# Patient Record
Sex: Male | Born: 1955 | Race: Black or African American | Hispanic: No | Marital: Single | Smoking: Current some day smoker
Health system: Southern US, Community
[De-identification: ages and names within clinical notes are randomized; demographics above are authoritative.]

---

## 2005-08-16 ENCOUNTER — Other Ambulatory Visit: Payer: Self-pay

## 2005-08-16 ENCOUNTER — Inpatient Hospital Stay: Payer: Self-pay | Admitting: Unknown Physician Specialty

## 2005-09-02 ENCOUNTER — Ambulatory Visit: Payer: Self-pay | Admitting: Internal Medicine

## 2007-03-01 IMAGING — CR DG TIBIA/FIBULA 2V*L*
1 series · 3 of 3 positions shown · non-contrast
Comparison: none

REASON FOR EXAM: left tib/fib pain
COMMENTS:  LMP: (Male)

RESULT:          AP and lateral view reveals a lateral tibial plateau
fracture as well as a comminuted fracture of the head of the fibula.  The
remainder of the tibia and fibula appear intact.

[Series 1: view not recorded · 0.17mm/px · 3 of 3 slices shown]
[im 1/3]
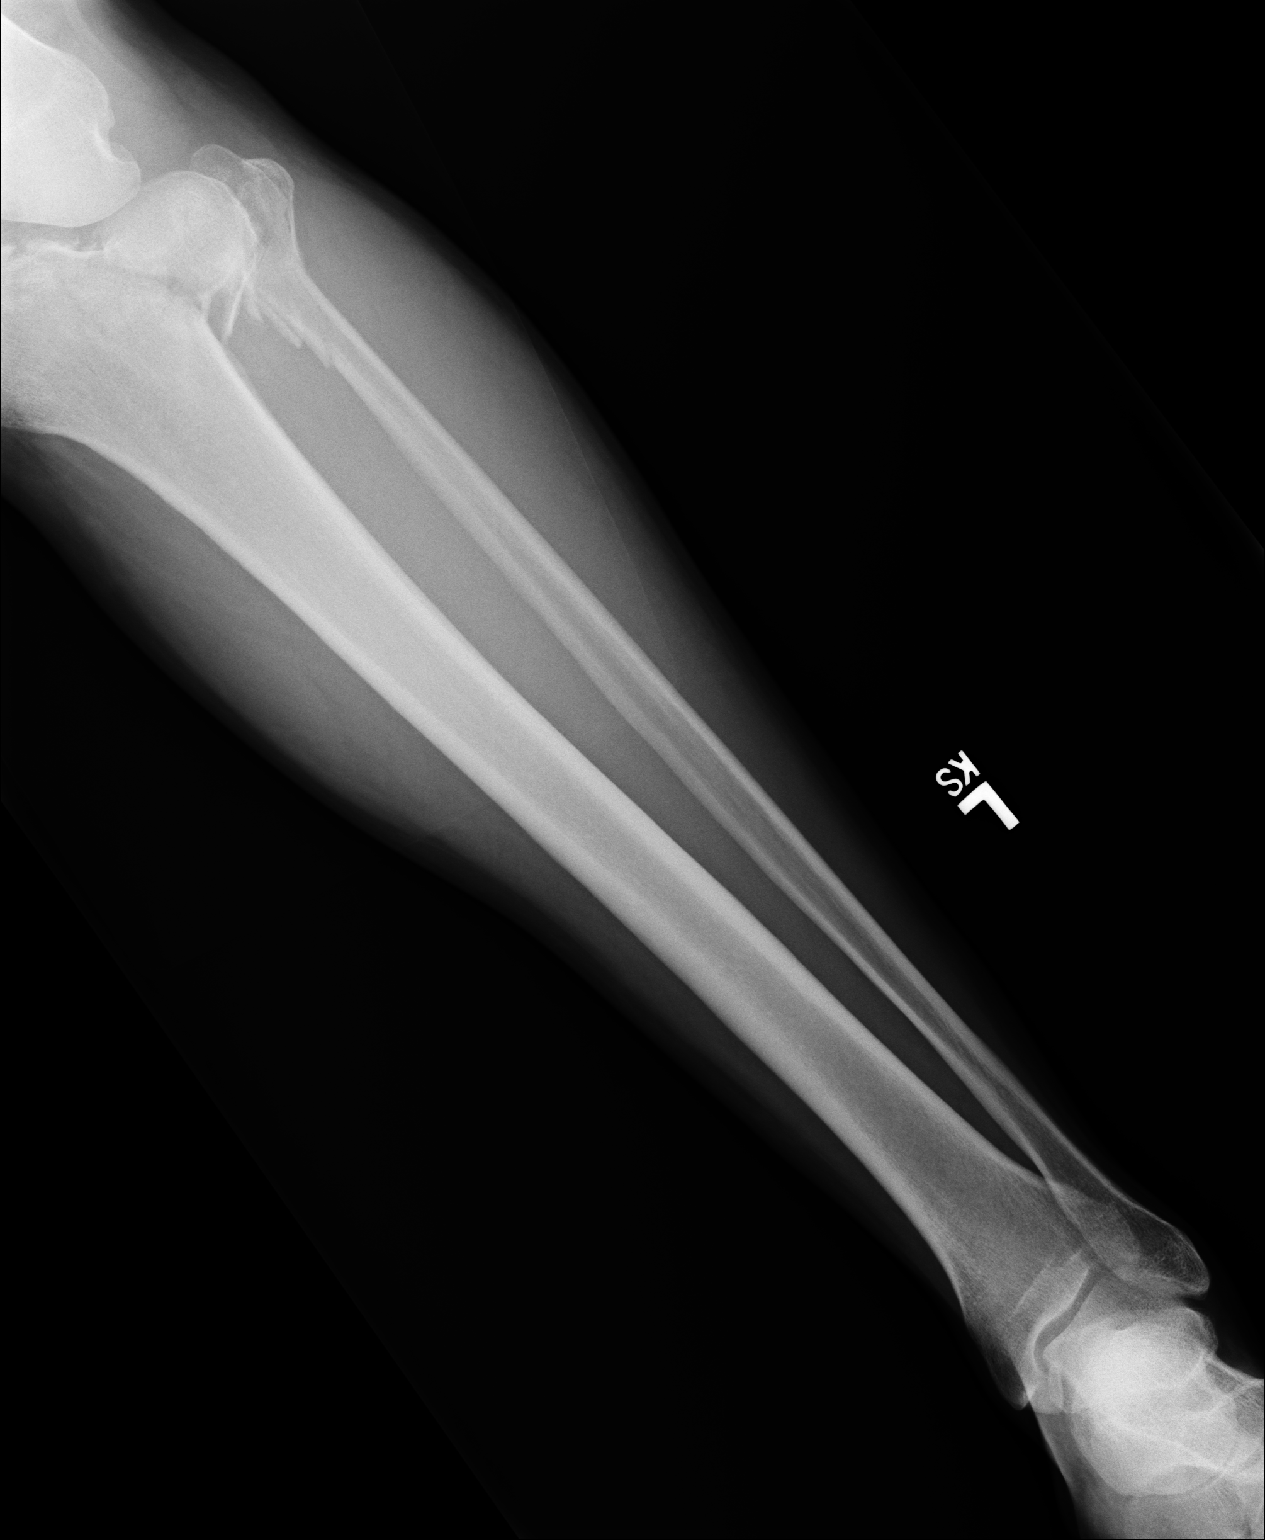
[im 2/3]
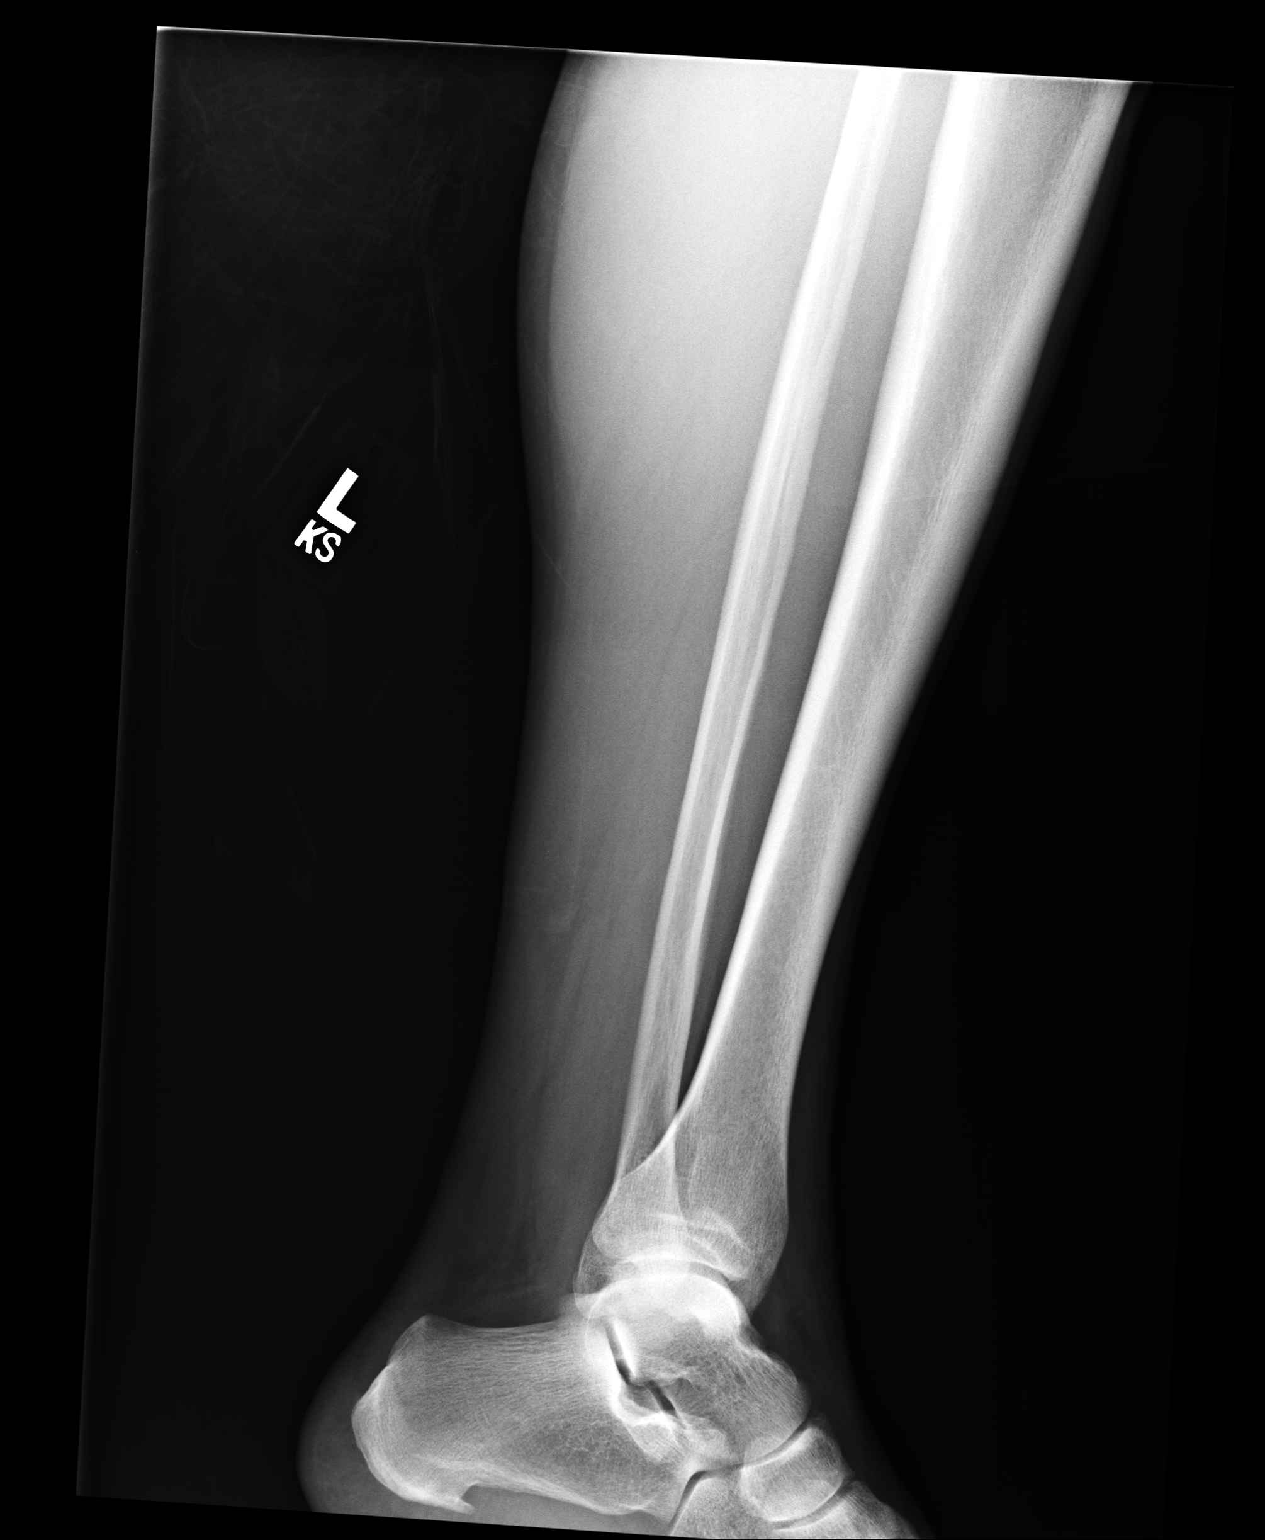
[im 3/3]
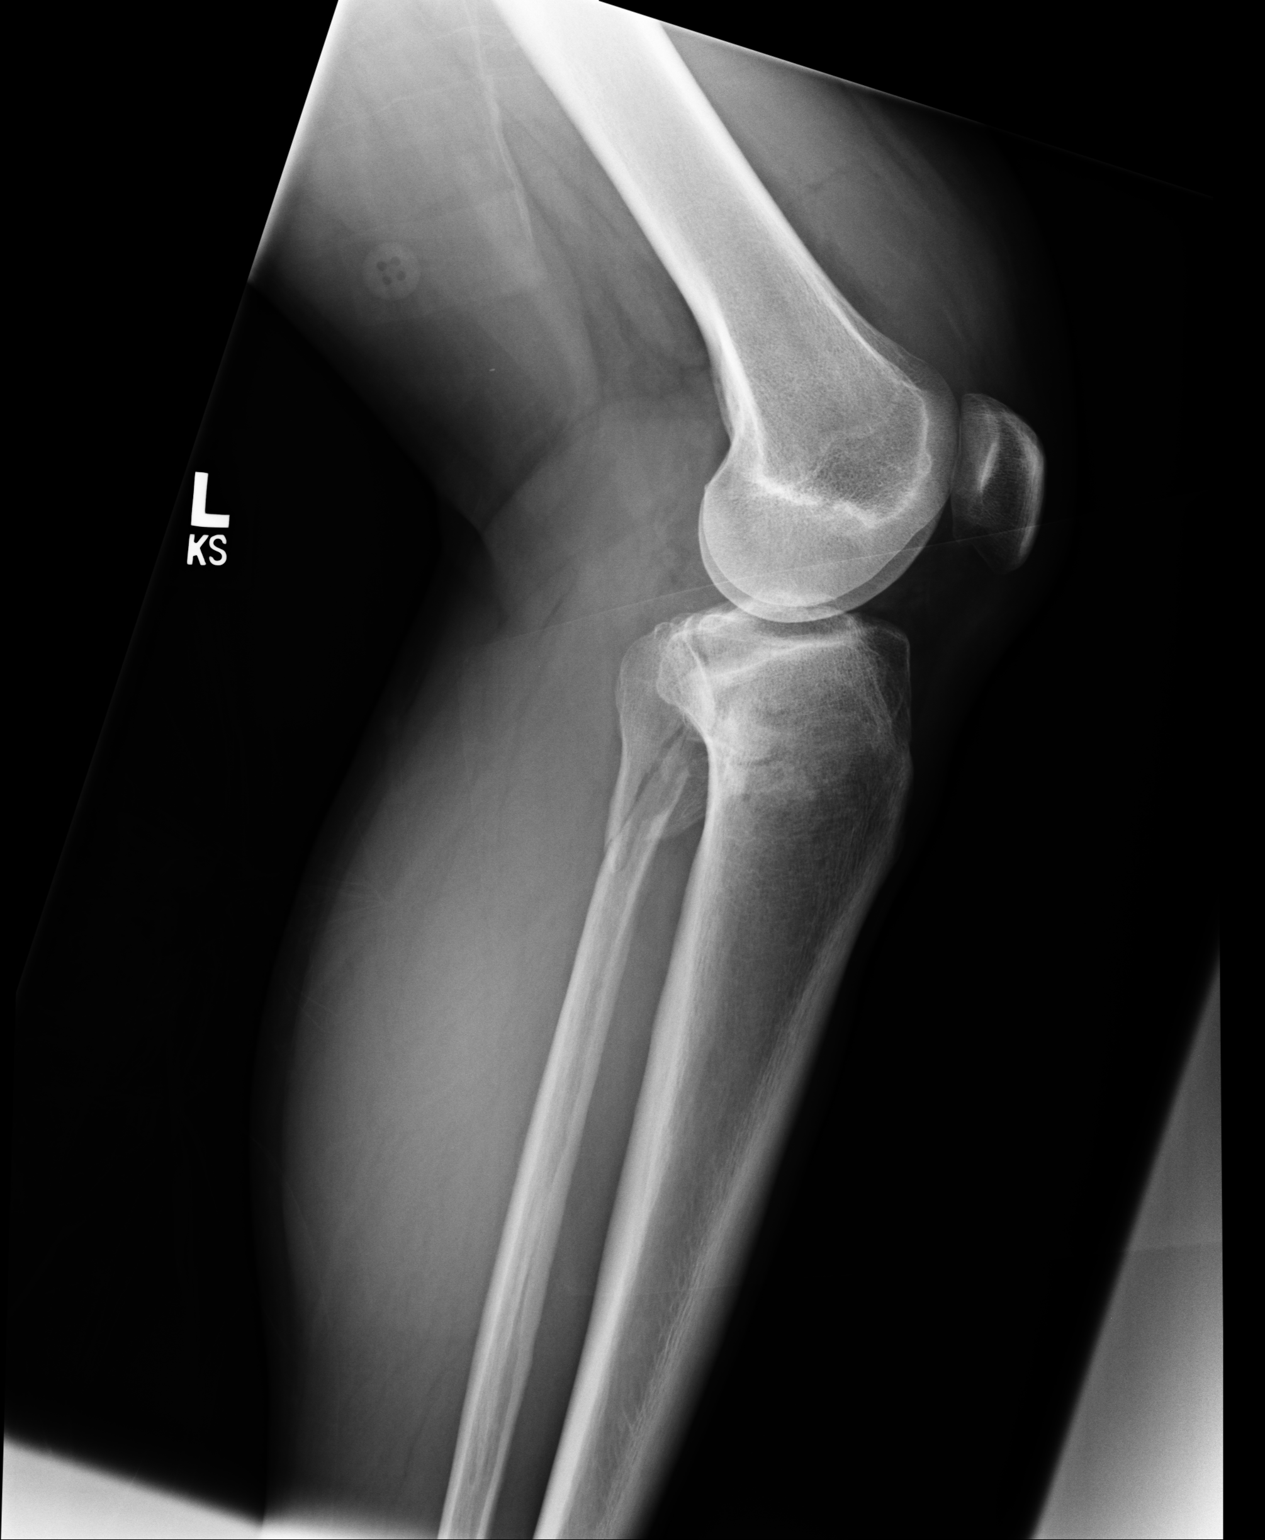

[3 of 3 positions shown; findings below may reference images not displayed]

IMPRESSION: Fracture of the proximal tibia and fibula.  No
additional fractures are seen.

## 2007-03-01 IMAGING — CT CT OF THE LEFT KNEE WITHOUT CONTRAST
3 of 6 series · 15 of 35 positions shown, 18 images · non-contrast
Comparison: none

REASON FOR EXAM: knee injury
COMMENTS:

[Series 4: inspace · axial · 0.74mm/px · z∈[+438,+642]mm · 8 of 375 slices shown, 10 images]
[im 42/375  soft-tissue]
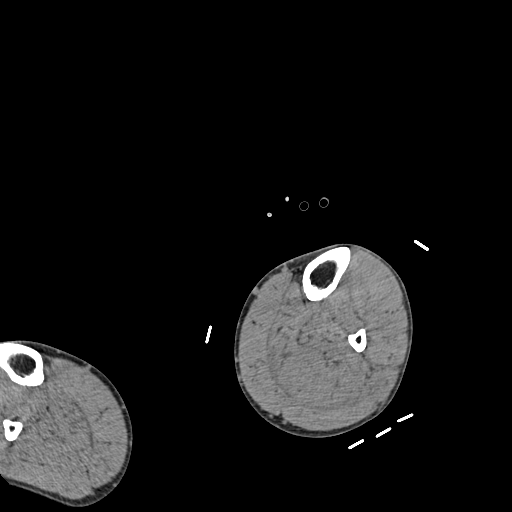
[im 42/375  bone]
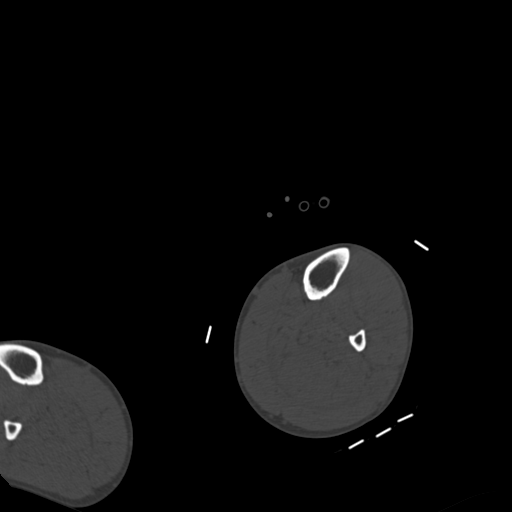
[im 84/375  bone]
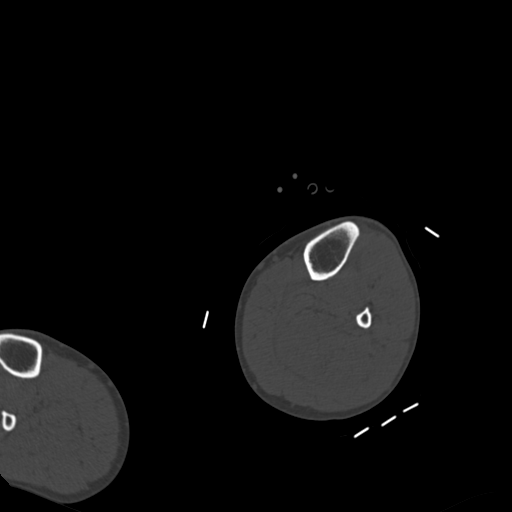
[im 125/375  bone]
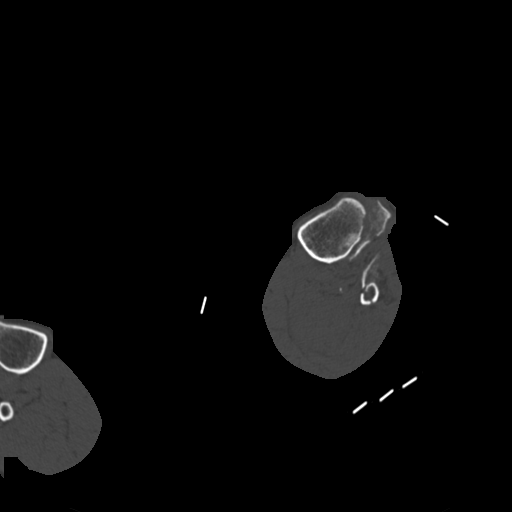
[im 167/375  bone]
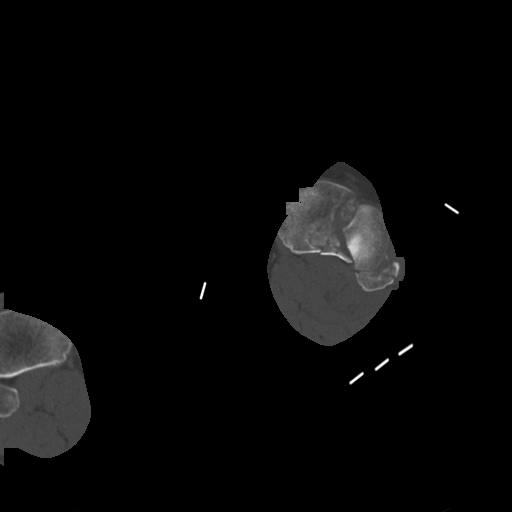
[im 208/375  soft-tissue]
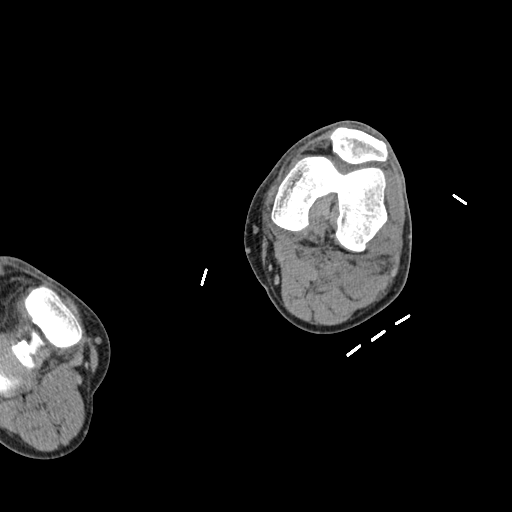
[im 208/375  bone]
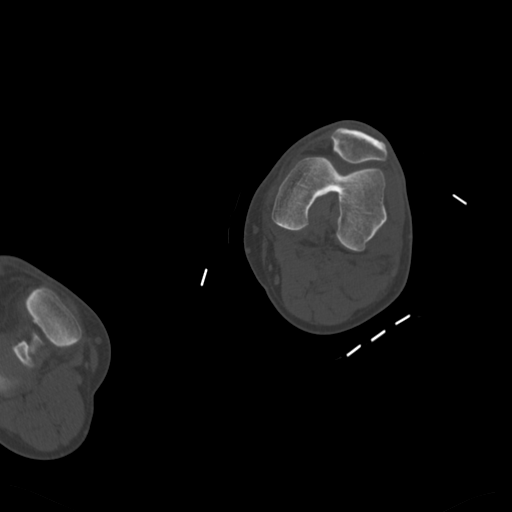
[im 250/375  bone]
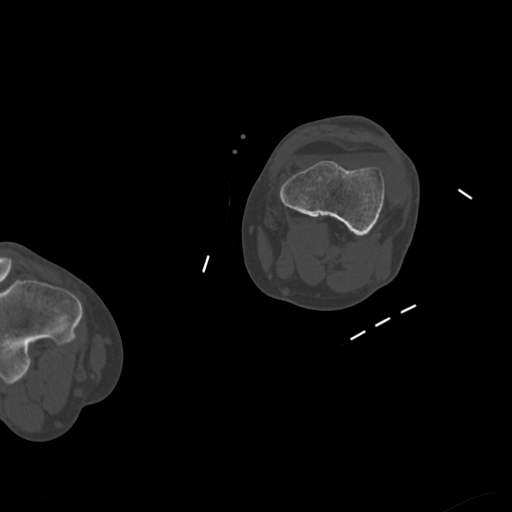
[im 291/375  bone]
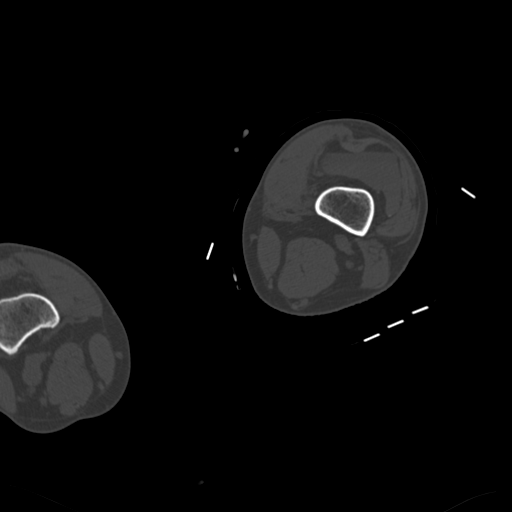
[im 333/375  bone]
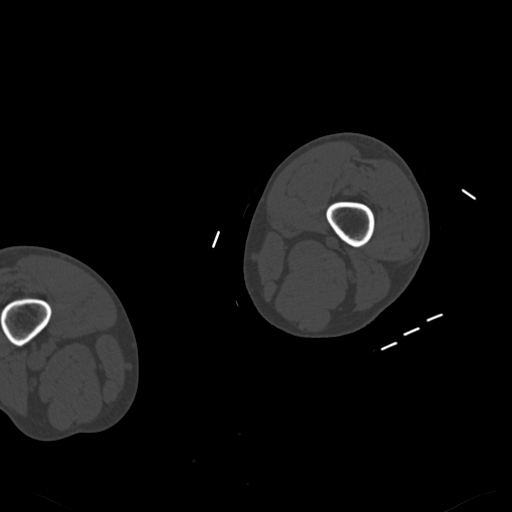

[Series 602: coronals · coronal · 0.74mm/px · 2 of 67 slices shown]
[im 18/67  bone]
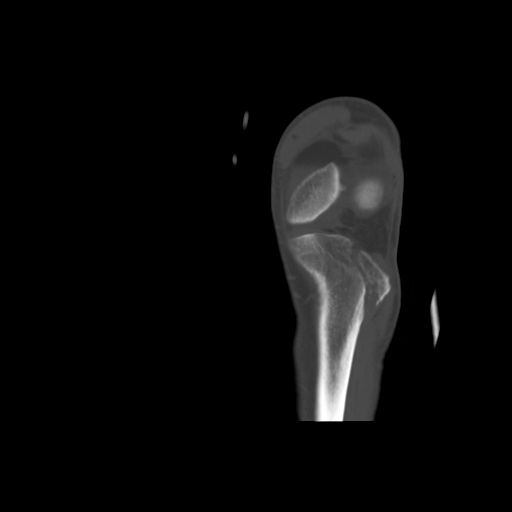
[im 42/67  bone]
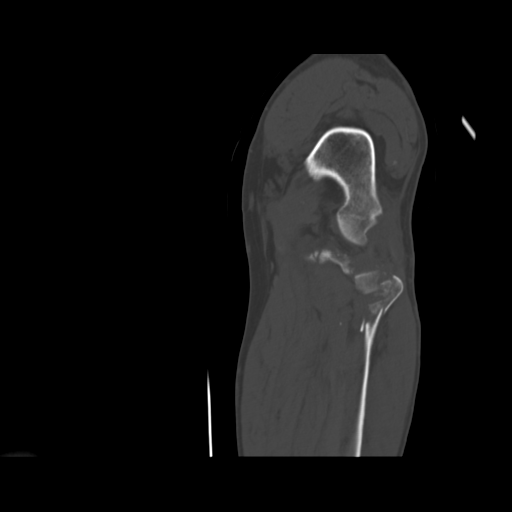

[Series 604: sagitals · sagittal · 0.74mm/px · 5 of 60 slices shown, 6 images]
[im 20/60  bone]
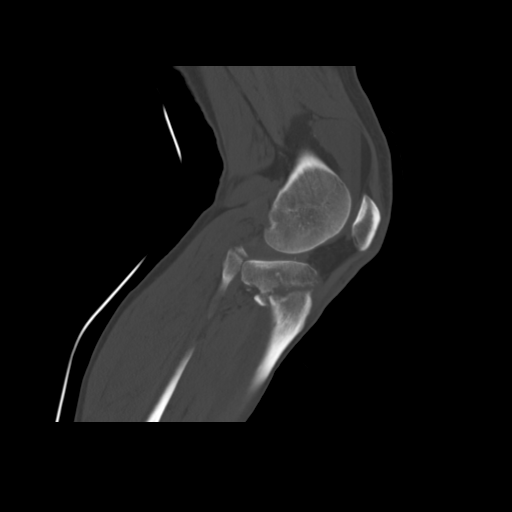
[im 25/60  bone]
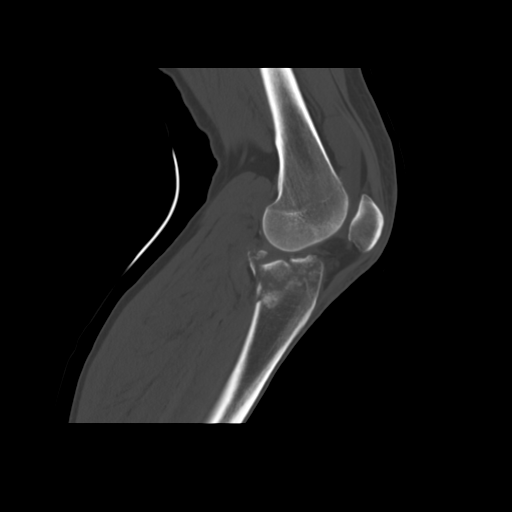
[im 30/60  soft-tissue]
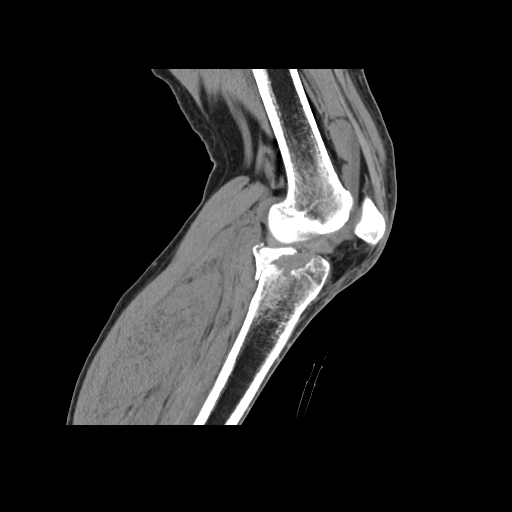
[im 30/60  bone]
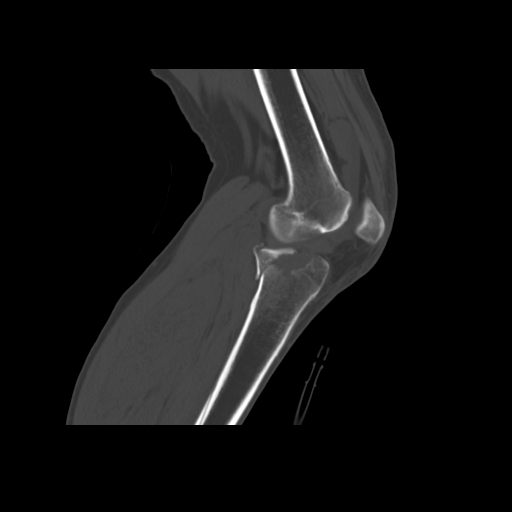
[im 35/60  bone]
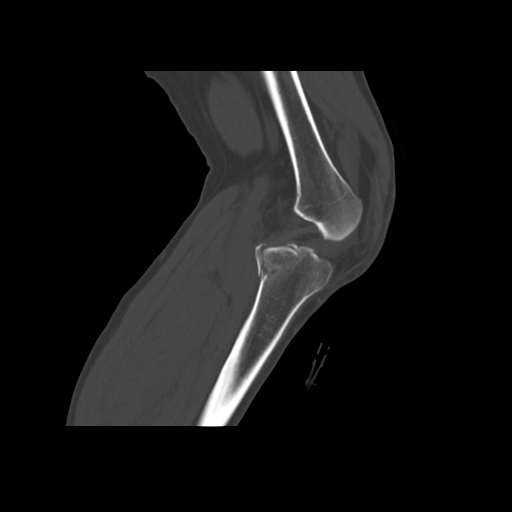
[im 40/60  bone]
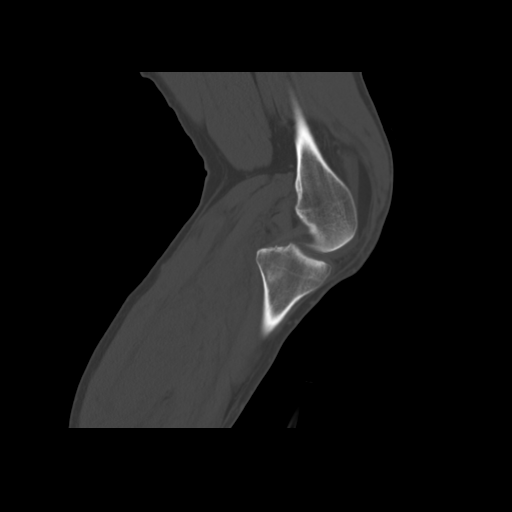

[15 of 35 positions shown; findings below may reference images not displayed]

PROCEDURE:     CT  - CT KNEE LEFT WO  - August 16, 2005  [DATE]

RESULT:          An emergent CT was performed status post a fall.

There is noted a comminuted fracture through the lateral tibial plateau.
The major fracture fragment is depressed approximately 1.2 cm.  Smaller
fracture fragments are noted near the intercondylar notch.  The fracture
line does extend posteriorly.  There is noted a comminuted fracture of the
fibular head and extending in the proximal diaphysis region of the fibula.
IMPRESSION: 1.     A comminuted lateral tibial plateau fracture with depression
approximately 1.2 cm.
2.     Comminuted fracture of the fibular head.

## 2020-04-03 ENCOUNTER — Encounter: Payer: Self-pay | Admitting: *Deleted

## 2020-04-03 ENCOUNTER — Encounter: Payer: No Typology Code available for payment source | Attending: Internal Medicine | Admitting: *Deleted

## 2020-04-03 ENCOUNTER — Other Ambulatory Visit: Payer: Self-pay

## 2020-04-03 DIAGNOSIS — I509 Heart failure, unspecified: Secondary | ICD-10-CM | POA: Insufficient documentation

## 2020-04-03 DIAGNOSIS — I5022 Chronic systolic (congestive) heart failure: Secondary | ICD-10-CM

## 2020-04-03 DIAGNOSIS — C159 Malignant neoplasm of esophagus, unspecified: Secondary | ICD-10-CM | POA: Insufficient documentation

## 2020-04-03 NOTE — Progress Notes (Signed)
Virtual orientation call completed today. he has an appointment on Date: 04/09/2020 for EP eval and gym Orientation.  Documentation of diagnosis can be found in media scan [facility] records  .  Hunter Tran is a current tobacco user. Intervention for tobacco cessation was provided at the initial medical review. He was asked about readiness to quit and reported he is ready to quit . Patient was advised and educated about tobacco cessation using combination therapy, tobacco cessation classes, quit line, and quit smoking apps. Patient demonstrated understanding of this material. Staff will continue to provide encouragement and follow up with the patient throughout the program.

## 2020-04-09 ENCOUNTER — Ambulatory Visit: Payer: Self-pay

## 2020-04-12 ENCOUNTER — Other Ambulatory Visit: Payer: Self-pay

## 2020-04-12 VITALS — Ht 70.25 in | Wt 236.7 lb

## 2020-04-12 DIAGNOSIS — I5022 Chronic systolic (congestive) heart failure: Secondary | ICD-10-CM

## 2020-04-12 NOTE — Patient Instructions (Signed)
Patient Instructions  Patient Details  Name: Hunter Tran MRN: [id number] Date of Birth: 03/06/1956 Referring Provider:  Yehuda Savannah, MD  Below are your personal goals for exercise, nutrition, and risk factors. Our goal is to help you stay on track towards obtaining and maintaining these goals. We will be discussing your progress on these goals with you throughout the program.  Initial Exercise Prescription:  Initial Exercise Prescription - 04/12/20 1300      Date of Initial Exercise RX and Referring Provider   Date 04/12/20    Referring Provider [name] [place] ([place])      Treadmill   MPH 2.3    Grade 0.5    Minutes 15    METs 2.92      Recumbant Bike   Level 3    RPM 60    Watts 20    Minutes 15    METs 2.7      NuStep   Level 2    SPM 80    Minutes 15    METs 2.7      T5 Nustep   Level 2    SPM 80    Minutes 15    METs 2.7      Prescription Details   Frequency (times per week) 2    Duration Progress to 30 minutes of continuous aerobic without signs/symptoms of physical distress      Intensity   THRR 40-80% of Max Heartrate 110-141    Ratings of Perceived Exertion 11-13    Perceived Dyspnea 0-4      Progression   Progression Continue to progress workloads to maintain intensity without signs/symptoms of physical distress.      Resistance Training   Training Prescription Yes    Weight 3 lb    Reps 10-15           Exercise Goals: Frequency: Be able to perform aerobic exercise two to three times per week in program working toward 2-5 days per week of home exercise.  Intensity: Work with a perceived exertion of 11 (fairly light) - 15 (hard) while following your exercise prescription.  We will make changes to your prescription with you as you progress through the program.   Duration: Be able to do 30 to 45 minutes of continuous aerobic exercise in addition to a 5 minute warm-up and a 5 minute cool-down routine.   Nutrition Goals: Your personal  nutrition goals will be established when you do your nutrition analysis with the dietician.  The following are general nutrition guidelines to follow: Cholesterol < 200mg /day Sodium < 1500mg /day Fiber: Men over 50 yrs - 30 grams per day  Personal Goals:  Personal Goals and Risk Factors at Admission - 04/12/20 1408      Core Components/Risk Factors/Patient Goals on Admission    Weight Management Weight Loss    Intervention Weight Management: Develop a combined nutrition and exercise program designed to reach desired caloric intake, while maintaining appropriate intake of nutrient and fiber, sodium and fats, and appropriate energy expenditure required for the weight goal.;Weight Management/Obesity: Establish reasonable short term and long term weight goals.    Admit Weight 236 lb (107 kg)    Goal Weight: Short Term 231 lb (104.8 kg)    Goal Weight: Long Term 190 lb (86.2 kg)    Expected Outcomes Short Term: Continue to assess and modify interventions until short term weight is achieved;Long Term: Adherence to nutrition and physical activity/exercise program aimed toward attainment of established weight goal;Weight  Loss: Understanding of general recommendations for a balanced deficit meal plan, which promotes 1-2 lb weight loss per week and includes a negative energy balance of [phone] kcal/d;Understanding recommendations for meals to include 15-35% energy as protein, 25-35% energy from fat, 35-60% energy from carbohydrates, less than 200mg  of dietary cholesterol, 20-35 gm of total fiber daily;Understanding of distribution of calorie intake throughout the day with the consumption of 4-5 meals/snacks    Tobacco Cessation Yes   Trying the nicotine patches, does not have a quit date but thinking about quitting   Number of packs per day Treigh is a current tobacco user. Intervention for tobacco cessation was provided at the initial medical review. He was asked about readiness to quit and reported he is  ready to quit . Patient was advised and educated about tobacco cessation using combination therapy, tobacco cessation classes, quit line, and quit smoking apps. Patient demonstrated understanding of this material. Staff will continue to provide encouragement and follow up with the patient throughout the program. Tareek currently smokes about 1/2 pack/day.    Intervention Assist the participant in steps to quit. Provide individualized education and counseling about committing to Tobacco Cessation, relapse prevention, and pharmacological support that can be provided by physician.;Advice worker, assist with locating and accessing local/national Quit Smoking programs, and support quit date choice.    Expected Outcomes Short Term: Will demonstrate readiness to quit, by selecting a quit date.;Short Term: Will quit all tobacco product use, adhering to prevention of relapse plan.;Long Term: Complete abstinence from all tobacco products for at least 12 months from quit date.    Diabetes Yes    Intervention Provide education about signs/symptoms and action to take for hypo/hyperglycemia.;Provide education about proper nutrition, including hydration, and aerobic/resistive exercise prescription along with prescribed medications to achieve blood glucose in normal ranges: Fasting glucose 65-99 mg/dL    Expected Outcomes Short Term: Participant verbalizes understanding of the signs/symptoms and immediate care of hyper/hypoglycemia, proper foot care and importance of medication, aerobic/resistive exercise and nutrition plan for blood glucose control.;Long Term: Attainment of HbA1C < 7%.    Heart Failure Yes    Intervention Provide a combined exercise and nutrition program that is supplemented with education, support and counseling about heart failure. Directed toward relieving symptoms such as shortness of breath, decreased exercise tolerance, and extremity edema.    Expected Outcomes Improve functional  capacity of life;Short term: Attendance in program 2-3 days a week with increased exercise capacity. Reported lower sodium intake. Reported increased fruit and vegetable intake. Reports medication compliance.;Short term: Daily weights obtained and reported for increase. Utilizing diuretic protocols set by physician.;Long term: Adoption of self-care skills and reduction of barriers for early signs and symptoms recognition and intervention leading to self-care maintenance.    Hypertension Yes    Intervention Provide education on lifestyle modifcations including regular physical activity/exercise, weight management, moderate sodium restriction and increased consumption of fresh fruit, vegetables, and low fat dairy, alcohol moderation, and smoking cessation.;Monitor prescription use compliance.    Expected Outcomes Short Term: Continued assessment and intervention until BP is < 140/54mm HG in hypertensive participants. < 130/58mm HG in hypertensive participants with diabetes, heart failure or chronic kidney disease.;Long Term: Maintenance of blood pressure at goal levels.    Lipids Yes    Intervention Provide education and support for participant on nutrition & aerobic/resistive exercise along with prescribed medications to achieve LDL 70mg , HDL >40mg .    Expected Outcomes Short Term: Participant states understanding of desired cholesterol values and  is compliant with medications prescribed. Participant is following exercise prescription and nutrition guidelines.;Long Term: Cholesterol controlled with medications as prescribed, with individualized exercise RX and with personalized nutrition plan. Value goals: LDL < 70mg , HDL > 40 mg.           Tobacco Use Initial Evaluation: Social History   Tobacco Use  Smoking Status Current Some Day Smoker  . Packs/day: 1.00  . Years: 50.00  . Pack years: 50.00  . Types: Cigarettes  Smokeless Tobacco Never Used  Tobacco Comment   Quit date soon     Exercise  Goals and Review:  Exercise Goals    Row Name 04/12/20 1406             Exercise Goals   Increase Physical Activity Yes       Intervention Provide advice, education, support and counseling about physical activity/exercise needs.;Develop an individualized exercise prescription for aerobic and resistive training based on initial evaluation findings, risk stratification, comorbidities and participant's personal goals.       Expected Outcomes Short Term: Attend rehab on a regular basis to increase amount of physical activity.;Long Term: Add in home exercise to make exercise part of routine and to increase amount of physical activity.;Long Term: Exercising regularly at least 3-5 days a week.       Increase Strength and Stamina Yes       Intervention Provide advice, education, support and counseling about physical activity/exercise needs.;Develop an individualized exercise prescription for aerobic and resistive training based on initial evaluation findings, risk stratification, comorbidities and participant's personal goals.       Expected Outcomes Short Term: Increase workloads from initial exercise prescription for resistance, speed, and METs.;Short Term: Perform resistance training exercises routinely during rehab and add in resistance training at home;Long Term: Improve cardiorespiratory fitness, muscular endurance and strength as measured by increased METs and functional capacity (6MWT)       Able to understand and use rate of perceived exertion (RPE) scale Yes       Intervention Provide education and explanation on how to use RPE scale       Expected Outcomes Short Term: Able to use RPE daily in rehab to express subjective intensity level;Long Term:  Able to use RPE to guide intensity level when exercising independently       Able to understand and use Dyspnea scale Yes       Intervention Provide education and explanation on how to use Dyspnea scale       Expected Outcomes Short Term: Able to use  Dyspnea scale daily in rehab to express subjective sense of shortness of breath during exertion;Long Term: Able to use Dyspnea scale to guide intensity level when exercising independently       Knowledge and understanding of Target Heart Rate Range (THRR) Yes       Intervention Provide education and explanation of THRR including how the numbers were predicted and where they are located for reference       Expected Outcomes Short Term: Able to state/look up THRR;Short Term: Able to use daily as guideline for intensity in rehab;Long Term: Able to use THRR to govern intensity when exercising independently       Able to check pulse independently Yes       Intervention Provide education and demonstration on how to check pulse in carotid and radial arteries.;Review the importance of being able to check your own pulse for safety during independent exercise       Expected  Outcomes Short Term: Able to explain why pulse checking is important during independent exercise;Long Term: Able to check pulse independently and accurately       Understanding of Exercise Prescription Yes       Intervention Provide education, explanation, and written materials on patient's individual exercise prescription       Expected Outcomes Short Term: Able to explain program exercise prescription;Long Term: Able to explain home exercise prescription to exercise independently              Copy of goals given to participant.

## 2020-04-12 NOTE — Progress Notes (Signed)
Cardiac Individual Treatment Plan  Patient Details  Name: Hunter Tran Tran MRN: [id number] Date of Birth: 1956-03-19 Referring Provider:   Flowsheet Row Cardiac Rehab from 04/12/2020 in [facility] Cardiac and Pulmonary Rehab  Referring Provider [name] MD ([place])      Initial Encounter Date:  Flowsheet Row Cardiac Rehab from 04/12/2020 in [facility] Cardiac and Pulmonary Rehab  Date 04/12/20      Visit Diagnosis: Heart failure, chronic systolic ([place])  Patient's Home Medications on Admission:  Current Outpatient Medications:  .  amLODipine (NORVASC) 10 MG tablet, Take 5 mg by mouth daily. Take 1/2 of 10 mg tab, Disp: , Rfl:  .  apixaban (ELIQUIS) 5 MG TABS tablet, Take 5 mg by mouth 2 (two) times daily. On hold until surgery, Disp: , Rfl:  .  ascorbic acid (VITAMIN C) 500 MG tablet, Take 500 mg by mouth 2 (two) times daily., Disp: , Rfl:  .  atorvastatin (LIPITOR) 80 MG tablet, Take 40 mg by mouth daily., Disp: , Rfl:  .  empagliflozin (JARDIANCE) 25 MG TABS tablet, Take 25 mg by mouth daily. Take one half tablet daily, Disp: , Rfl:  .  ferrous sulfate 325 (65 FE) MG tablet, Take 325 mg by mouth daily with breakfast., Disp: , Rfl:  .  insulin glargine (LANTUS) 100 UNIT/ML injection, Inject 50 Units into the skin daily., Disp: , Rfl:  .  liraglutide (VICTOZA) 18 MG/3ML SOPN, Inject 1.2 mg into the skin daily., Disp: , Rfl:  .  metFORMIN (GLUCOPHAGE) 1000 MG tablet, Take 1,000 mg by mouth 2 (two) times daily with a meal., Disp: , Rfl:  .  metoprolol (TOPROL-XL) 200 MG 24 hr tablet, Take 200 mg by mouth daily., Disp: , Rfl:  .  nicotine (NICODERM CQ - DOSED IN MG/24 HOURS) 14 mg/24hr patch, Place 14 mg onto the skin daily., Disp: , Rfl:  .  nicotine (NICODERM CQ - DOSED IN MG/24 HR) 7 mg/24hr patch, Place 7 mg onto the skin daily. Use after 10m patch completed, Disp: , Rfl:  .  nicotine polacrilex (NICORETTE) 2 MG gum, Take 2 mg by mouth as needed for smoking cessation., Disp: , Rfl:  .  omeprazole  (PRILOSEC) 20 MG capsule, Take 20 mg by mouth 2 (two) times daily., Disp: , Rfl:  .  torsemide (DEMADEX) 20 MG tablet, Take 20 mg by mouth daily., Disp: , Rfl:  .  valsartan (DIOVAN) 320 MG tablet, Take 320 mg by mouth daily., Disp: , Rfl:   Past Medical History: No past medical history on file.  Tobacco Use: Social History   Tobacco Use  Smoking Status Current Some Day Smoker  . Packs/day: 1.00  . Years: 50.00  . Pack years: 50.00  . Types: Cigarettes  Smokeless Tobacco Never Used  Tobacco Comment   Quit date soon     Labs: Recent Review Flowsheet Data   There is no flowsheet data to display.      Exercise Target Goals: Exercise Program Goal: Individual exercise prescription set using results from initial 6 min walk test and THRR while considering  patient's activity barriers and safety.   Exercise Prescription Goal: Initial exercise prescription builds to 30-45 minutes a day of aerobic activity, 2-3 days per week.  Home exercise guidelines will be given to patient during program as part of exercise prescription that the participant will acknowledge.   Education: Aerobic Exercise: - Group verbal and visual presentation on the components of exercise prescription. Introduces F.I.T.T principle from ACSM for exercise  prescriptions.  Reviews F.I.T.T. principles of aerobic exercise including progression. Written material given at graduation.   Education: Resistance Exercise: - Group verbal and visual presentation on the components of exercise prescription. Introduces F.I.T.T principle from ACSM for exercise prescriptions  Reviews F.I.T.T. principles of resistance exercise including progression. Written material given at graduation.    Education: Exercise & Equipment Safety: - Individual verbal instruction and demonstration of equipment use and safety with use of the equipment. Flowsheet Row Cardiac Rehab from 04/12/2020 in [facility] Cardiac and Pulmonary Rehab  Education need  identified 04/12/20  Date 04/12/20  Educator [place]  Instruction Review Code 1- Verbalizes Understanding      Education: Exercise Physiology & General Exercise Guidelines: - Group verbal and written instruction with models to review the exercise physiology of the cardiovascular system and associated critical values. Provides general exercise guidelines with specific guidelines to those with heart or lung disease.    Education: Flexibility, Balance, Mind/Body Relaxation: - Group verbal and visual presentation with interactive activity on the components of exercise prescription. Introduces F.I.T.T principle from ACSM for exercise prescriptions. Reviews F.I.T.T. principles of flexibility and balance exercise training including progression. Also discusses the mind body connection.  Reviews various relaxation techniques to help reduce and manage stress (i.e. Deep breathing, progressive muscle relaxation, and visualization). Balance handout provided to take home. Written material given at graduation.   Activity Barriers & Risk Stratification:  Activity Barriers & Cardiac Risk Stratification - 04/12/20 1350      Activity Barriers & Cardiac Risk Stratification   Activity Barriers Arthritis    Cardiac Risk Stratification High           6 Minute Walk:  6 Minute Walk    Row Name 04/12/20 1349         6 Minute Walk   Phase Initial     Distance 1180 feet     Walk Time 6 minutes     # of Rest Breaks 0     MPH 2.23     METS 2.7     RPE 7     Perceived Dyspnea  0     VO2 Peak 9.45     Symptoms No     Resting HR 80 bpm     Resting BP 116/64     Resting Oxygen Saturation  97 %     Exercise Oxygen Saturation  during 6 min walk 93 %     Max Ex. HR 91 bpm     Max Ex. BP 140/66     2 Minute Post BP 122/66            Oxygen Initial Assessment:   Oxygen Re-Evaluation:   Oxygen Discharge (Final Oxygen Re-Evaluation):   Initial Exercise Prescription:  Initial Exercise Prescription  - 04/12/20 1300      Date of Initial Exercise RX and Referring Provider   Date 04/12/20    Referring Provider [name] MD ([place])      Treadmill   MPH 2.3    Grade 0.5    Minutes 15    METs 2.92      Recumbant Bike   Level 3    RPM 60    Watts 20    Minutes 15    METs 2.7      NuStep   Level 2    SPM 80    Minutes 15    METs 2.7      T5 Nustep   Level 2  SPM 80    Minutes 15    METs 2.7      Prescription Details   Frequency (times per week) 2    Duration Progress to 30 minutes of continuous aerobic without signs/symptoms of physical distress      Intensity   THRR 40-80% of Max Heartrate 110-141    Ratings of Perceived Exertion 11-13    Perceived Dyspnea 0-4      Progression   Progression Continue to progress workloads to maintain intensity without signs/symptoms of physical distress.      Resistance Training   Training Prescription Yes    Weight 3 lb    Reps 10-15           Perform Capillary Blood Glucose checks as needed.  Exercise Prescription Changes:   Exercise Prescription Changes    Row Name 04/12/20 1400             Response to Exercise   Blood Pressure (Admit) 116/64       Blood Pressure (Exercise) 140/66       Blood Pressure (Exit) 122/66       Heart Rate (Admit) 80 bpm       Heart Rate (Exercise) 91 bpm       Heart Rate (Exit) 81 bpm       Oxygen Saturation (Admit) 97 %       Oxygen Saturation (Exercise) 93 %       Oxygen Saturation (Exit) 97 %       Rating of Perceived Exertion (Exercise) 7       Perceived Dyspnea (Exercise) 0       Symptoms none       Comments walk test results               Resistance Training   Training Prescription Yes       Weight 3 lb       Reps 10-15               Treadmill   MPH 2.3       Grade 0.5       Minutes 15       METs 2.92               Recumbant Bike   Level 3       RPM 60       Watts 20       Minutes 15       METs 2.7               NuStep   Level 2       SPM 80        Minutes 15       METs 2.7               T5 Nustep   Level 2       SPM 80       Minutes 15       METs 2.7              Exercise Comments:   Exercise Goals and Review:   Exercise Goals    Row Name 04/12/20 1406             Exercise Goals   Increase Physical Activity Yes       Intervention Provide advice, education, support and counseling about physical activity/exercise needs.;Develop an individualized exercise prescription for aerobic and resistive training based on initial evaluation findings, risk stratification,  comorbidities and participant's personal goals.       Expected Outcomes Short Term: Attend rehab on a regular basis to increase amount of physical activity.;Long Term: Add in home exercise to make exercise part of routine and to increase amount of physical activity.;Long Term: Exercising regularly at least 3-5 days a week.       Increase Strength and Stamina Yes       Intervention Provide advice, education, support and counseling about physical activity/exercise needs.;Develop an individualized exercise prescription for aerobic and resistive training based on initial evaluation findings, risk stratification, comorbidities and participant's personal goals.       Expected Outcomes Short Term: Increase workloads from initial exercise prescription for resistance, speed, and METs.;Short Term: Perform resistance training exercises routinely during rehab and add in resistance training at home;Long Term: Improve cardiorespiratory fitness, muscular endurance and strength as measured by increased METs and functional capacity (6MWT)       Able to understand and use rate of perceived exertion (RPE) scale Yes       Intervention Provide education and explanation on how to use RPE scale       Expected Outcomes Short Term: Able to use RPE daily in rehab to express subjective intensity level;Long Term:  Able to use RPE to guide intensity level when exercising independently       Able  to understand and use Dyspnea scale Yes       Intervention Provide education and explanation on how to use Dyspnea scale       Expected Outcomes Short Term: Able to use Dyspnea scale daily in rehab to express subjective sense of shortness of breath during exertion;Long Term: Able to use Dyspnea scale to guide intensity level when exercising independently       Knowledge and understanding of Target Heart Rate Range (THRR) Yes       Intervention Provide education and explanation of THRR including how the numbers were predicted and where they are located for reference       Expected Outcomes Short Term: Able to state/look up THRR;Short Term: Able to use daily as guideline for intensity in rehab;Long Term: Able to use THRR to govern intensity when exercising independently       Able to check pulse independently Yes       Intervention Provide education and demonstration on how to check pulse in carotid and radial arteries.;Review the importance of being able to check your own pulse for safety during independent exercise       Expected Outcomes Short Term: Able to explain why pulse checking is important during independent exercise;Long Term: Able to check pulse independently and accurately       Understanding of Exercise Prescription Yes       Intervention Provide education, explanation, and written materials on patient's individual exercise prescription       Expected Outcomes Short Term: Able to explain program exercise prescription;Long Term: Able to explain home exercise prescription to exercise independently              Exercise Goals Re-Evaluation :   Discharge Exercise Prescription (Final Exercise Prescription Changes):  Exercise Prescription Changes - 04/12/20 1400      Response to Exercise   Blood Pressure (Admit) 116/64    Blood Pressure (Exercise) 140/66    Blood Pressure (Exit) 122/66    Heart Rate (Admit) 80 bpm    Heart Rate (Exercise) 91 bpm    Heart Rate (Exit) 81 bpm     Oxygen  Saturation (Admit) 97 %    Oxygen Saturation (Exercise) 93 %    Oxygen Saturation (Exit) 97 %    Rating of Perceived Exertion (Exercise) 7    Perceived Dyspnea (Exercise) 0    Symptoms none    Comments walk test results      Resistance Training   Training Prescription Yes    Weight 3 lb    Reps 10-15      Treadmill   MPH 2.3    Grade 0.5    Minutes 15    METs 2.92      Recumbant Bike   Level 3    RPM 60    Watts 20    Minutes 15    METs 2.7      NuStep   Level 2    SPM 80    Minutes 15    METs 2.7      T5 Nustep   Level 2    SPM 80    Minutes 15    METs 2.7           Nutrition:  Target Goals: Understanding of nutrition guidelines, daily intake of sodium 1500mg , cholesterol 200mg , calories 30% from fat and 7% or less from saturated fats, daily to have 5 or more servings of fruits and vegetables.  Education: All About Nutrition: -Group instruction provided by verbal, written material, interactive activities, discussions, models, and posters to present general guidelines for heart healthy nutrition including fat, fiber, MyPlate, the role of sodium in heart healthy nutrition, utilization of the nutrition label, and utilization of this knowledge for meal planning. Follow up email sent as well. Written material given at graduation.   Biometrics:  Pre Biometrics - 04/12/20 1407      Pre Biometrics   Height 5' 10.25" (1.784 m)    Weight 236 lb 11.2 oz (107.4 kg)    BMI (Calculated) 33.73    Single Leg Stand 6.28 seconds            Nutrition Therapy Plan and Nutrition Goals:   Nutrition Assessments:  MEDIFICTS Score Key:  ?70 Need to make dietary changes   40-70 Heart Healthy Diet  ? 40 Therapeutic Level Cholesterol Diet   Picture Your Plate Scores:  <01 Unhealthy dietary pattern with much room for improvement.  41-50 Dietary pattern unlikely to meet recommendations for good health and room for improvement.  51-60 More healthful  dietary pattern, with some room for improvement.   >60 Healthy dietary pattern, although there may be some specific behaviors that could be improved.    Nutrition Goals Re-Evaluation:   Nutrition Goals Discharge (Final Nutrition Goals Re-Evaluation):   Psychosocial: Target Goals: Acknowledge presence or absence of significant depression and/or stress, maximize coping skills, provide positive support system. Participant is able to verbalize types and ability to use techniques and skills needed for reducing stress and depression.   Education: Stress, Anxiety, and Depression - Group verbal and visual presentation to define topics covered.  Reviews how body is impacted by stress, anxiety, and depression.  Also discusses healthy ways to reduce stress and to treat/manage anxiety and depression.  Written material given at graduation.   Education: Sleep Hygiene -Provides group verbal and written instruction about how sleep can affect your health.  Define sleep hygiene, discuss sleep cycles and impact of sleep habits. Review good sleep hygiene tips.    Initial Review & Psychosocial Screening:  Initial Psych Review & Screening - 04/03/20 1553      Initial  Review   Current issues with None Identified      Family Dynamics   Good Support System? Yes   cousin, friends, lots of kinfolks     Barriers   Psychosocial barriers to participate in program There are no identifiable barriers or psychosocial needs.;The patient should benefit from training in stress management and relaxation.      Screening Interventions   Interventions Encouraged to exercise;To provide support and resources with identified psychosocial needs;Provide feedback about the scores to participant    Expected Outcomes Short Term goal: Utilizing psychosocial counselor, staff and physician to assist with identification of specific Stressors or current issues interfering with healing process. Setting desired goal for each stressor  or current issue identified.;Long Term Goal: Stressors or current issues are controlled or eliminated.;Short Term goal: Identification and review with participant of any Quality of Life or Depression concerns found by scoring the questionnaire.;Long Term goal: The participant improves quality of Life and PHQ9 Scores as seen by post scores and/or verbalization of changes           Quality of Life Scores:   Quality of Life - 04/12/20 1130      Quality of Life   Select Quality of Life      Quality of Life Scores   Health/Function Pre 16.33 %    Socioeconomic Pre 18.25 %    Psych/Spiritual Pre 18.43 %    Family Pre 16.25 %    GLOBAL Pre 17.21 %          Scores of 19 and below usually indicate a poorer quality of life in these areas.  A difference of  2-3 points is a clinically meaningful difference.  A difference of 2-3 points in the total score of the Quality of Life Index has been associated with significant improvement in overall quality of life, self-image, physical symptoms, and general health in studies assessing change in quality of life.  PHQ-9: Recent Review Flowsheet Data    Depression screen [facility] 2/9 04/12/2020   Decreased Interest 0   Down, Depressed, Hopeless 0   PHQ - 2 Score 0   Altered sleeping 0   Tired, decreased energy 1   Change in appetite 0   Feeling bad or failure about yourself  0   Trouble concentrating 0   Moving slowly or fidgety/restless 0   Suicidal thoughts 0   PHQ-9 Score 1   Difficult doing work/chores Not difficult at all     Interpretation of Total Score  Total Score Depression Severity:  1-4 = Minimal depression, 5-9 = Mild depression, 10-14 = Moderate depression, 15-19 = Moderately severe depression, 20-27 = Severe depression   Psychosocial Evaluation and Intervention:  Psychosocial Evaluation - 04/03/20 1608      Psychosocial Evaluation & Interventions   Interventions Encouraged to exercise with the program and follow exercise  prescription    Comments Hunter Tran is ready to attend the prgram without any barriers. He lives alone and has no needs. He spends his time watching TV or sitting on the porch watching the cars go by. He has friends that drop in to see him. He is trying to Quit tobacco for an upcoming surgery. He has quit before and feels he can do this. He has nicotine patches and gum being sent to him from the [place]. He should do well with the program, as he is looking forward to getting stronger and quitting tobacco.    Expected Outcomes STG; Hunter Tran Tran attends all session scheduled LTG:  Hunter Tran Tran is able to accomplish tobacco cessation and then maintain it after discharge    Continue Psychosocial Services  Follow up required by staff           Psychosocial Re-Evaluation:   Psychosocial Discharge (Final Psychosocial Re-Evaluation):   Vocational Rehabilitation: Provide vocational rehab assistance to qualifying candidates.   Vocational Rehab Evaluation & Intervention:  Vocational Rehab - 04/03/20 1555      Initial Vocational Rehab Evaluation & Intervention   Assessment shows need for Vocational Rehabilitation No           Education: Education Goals: Education classes will be provided on a variety of topics geared toward better understanding of heart health and risk factor modification. Participant will state understanding/return demonstration of topics presented as noted by education test scores.  Learning Barriers/Preferences:  Learning Barriers/Preferences - 04/03/20 1554      Learning Barriers/Preferences   Learning Barriers None    Learning Preferences None           General Cardiac Education Topics:  AED/CPR: - Group verbal and written instruction with the use of models to demonstrate the basic use of the AED with the basic ABC's of resuscitation.   Anatomy and Cardiac Procedures: - Group verbal and visual presentation and models provide information about basic cardiac anatomy and function.  Reviews the testing methods done to diagnose heart disease and the outcomes of the test results. Describes the treatment choices: Medical Management, Angioplasty, or Coronary Bypass Surgery for treating various heart conditions including Myocardial Infarction, Angina, Valve Disease, and Cardiac Arrhythmias.  Written material given at graduation.   Medication Safety: - Group verbal and visual instruction to review commonly prescribed medications for heart and lung disease. Reviews the medication, class of the drug, and side effects. Includes the steps to properly store meds and maintain the prescription regimen.  Written material given at graduation.   Intimacy: - Group verbal instruction through game format to discuss how heart and lung disease can affect sexual intimacy. Written material given at graduation..   Know Your Numbers and Heart Failure: - Group verbal and visual instruction to discuss disease risk factors for cardiac and pulmonary disease and treatment options.  Reviews associated critical values for Overweight/Obesity, Hypertension, Cholesterol, and Diabetes.  Discusses basics of heart failure: signs/symptoms and treatments.  Introduces Heart Failure Zone chart for action plan for heart failure.  Written material given at graduation.   Infection Prevention: - Provides verbal and written material to individual with discussion of infection control including proper hand washing and proper equipment cleaning during exercise session. Flowsheet Row Cardiac Rehab from 04/12/2020 in [facility] Cardiac and Pulmonary Rehab  Education need identified 04/12/20  Date 04/12/20  Educator Hunter Tran Tran  Instruction Review Code 1- Verbalizes Understanding      Falls Prevention: - Provides verbal and written material to individual with discussion of falls prevention and safety. Flowsheet Row Cardiac Rehab from 04/03/2020 in [facility] Cardiac and Pulmonary Rehab  Date 04/03/20  Educator Hunter Tran  Instruction Review Code  1- Verbalizes Understanding      Other: -Provides group and verbal instruction on various topics (see comments)   Knowledge Questionnaire Score:  Knowledge Questionnaire Score - 04/12/20 1127      Knowledge Questionnaire Score   Pre Score 23/26: Angina, Heart disease, Exercise           Core Components/Risk Factors/Patient Goals at Admission:  Personal Goals and Risk Factors at Admission - 04/12/20 1408      Core Components/Risk Factors/Patient Goals  on Admission    Weight Management Weight Loss    Intervention Weight Management: Develop a combined nutrition and exercise program designed to reach desired caloric intake, while maintaining appropriate intake of nutrient and fiber, sodium and fats, and appropriate energy expenditure required for the weight goal.;Weight Management/Obesity: Establish reasonable short term and long term weight goals.    Admit Weight 236 lb (107 kg)    Goal Weight: Short Term 231 lb (104.8 kg)    Goal Weight: Long Term 190 lb (86.2 kg)    Expected Outcomes Short Term: Continue to assess and modify interventions until short term weight is achieved;Long Term: Adherence to nutrition and physical activity/exercise program aimed toward attainment of established weight goal;Weight Loss: Understanding of general recommendations for a balanced deficit meal plan, which promotes 1-2 lb weight loss per week and includes a negative energy balance of [phone] kcal/d;Understanding recommendations for meals to include 15-35% energy as protein, 25-35% energy from fat, 35-60% energy from carbohydrates, less than 200mg  of dietary cholesterol, 20-35 gm of total fiber daily;Understanding of distribution of calorie intake throughout the day with the consumption of 4-5 meals/snacks    Tobacco Cessation Yes   Trying the nicotine patches, does not have a quit date but thinking about quitting   Number of packs per day Hunter Tran Tran is a current tobacco user. Intervention for tobacco cessation  was provided at the initial medical review. He was asked about readiness to quit and reported he is ready to quit . Patient was advised and educated about tobacco cessation using combination therapy, tobacco cessation classes, quit line, and quit smoking apps. Patient demonstrated understanding of this material. Staff will continue to provide encouragement and follow up with the patient throughout the program. Hunter Tran currently smokes about 1/2 pack/day.    Intervention Assist the participant in steps to quit. Provide individualized education and counseling about committing to Tobacco Cessation, relapse prevention, and pharmacological support that can be provided by physician.;Advice worker, assist with locating and accessing local/national Quit Smoking programs, and support quit date choice.    Expected Outcomes Short Term: Will demonstrate readiness to quit, by selecting a quit date.;Short Term: Will quit all tobacco product use, adhering to prevention of relapse plan.;Long Term: Complete abstinence from all tobacco products for at least 12 months from quit date.    Diabetes Yes    Intervention Provide education about signs/symptoms and action to take for hypo/hyperglycemia.;Provide education about proper nutrition, including hydration, and aerobic/resistive exercise prescription along with prescribed medications to achieve blood glucose in normal ranges: Fasting glucose 65-99 mg/dL    Expected Outcomes Short Term: Participant verbalizes understanding of the signs/symptoms and immediate care of hyper/hypoglycemia, proper foot care and importance of medication, aerobic/resistive exercise and nutrition plan for blood glucose control.;Long Term: Attainment of HbA1C < 7%.    Heart Failure Yes    Intervention Provide a combined exercise and nutrition program that is supplemented with education, support and counseling about heart failure. Directed toward relieving symptoms such as shortness of  breath, decreased exercise tolerance, and extremity edema.    Expected Outcomes Improve functional capacity of life;Short term: Attendance in program 2-3 days a week with increased exercise capacity. Reported lower sodium intake. Reported increased fruit and vegetable intake. Reports medication compliance.;Short term: Daily weights obtained and reported for increase. Utilizing diuretic protocols set by physician.;Long term: Adoption of self-care skills and reduction of barriers for early signs and symptoms recognition and intervention leading to self-care maintenance.    Hypertension Yes  Intervention Provide education on lifestyle modifcations including regular physical activity/exercise, weight management, moderate sodium restriction and increased consumption of fresh fruit, vegetables, and low fat dairy, alcohol moderation, and smoking cessation.;Monitor prescription use compliance.    Expected Outcomes Short Term: Continued assessment and intervention until BP is < 140/39mm HG in hypertensive participants. < 130/63mm HG in hypertensive participants with diabetes, heart failure or chronic kidney disease.;Long Term: Maintenance of blood pressure at goal levels.    Lipids Yes    Intervention Provide education and support for participant on nutrition & aerobic/resistive exercise along with prescribed medications to achieve LDL 70mg , HDL >40mg .    Expected Outcomes Short Term: Participant states understanding of desired cholesterol values and is compliant with medications prescribed. Participant is following exercise prescription and nutrition guidelines.;Long Term: Cholesterol controlled with medications as prescribed, with individualized exercise RX and with personalized nutrition plan. Value goals: LDL < 70mg , HDL > 40 mg.           Education:Diabetes - Individual verbal and written instruction to review signs/symptoms of diabetes, desired ranges of glucose level fasting, after meals and with  exercise. Acknowledge that pre and post exercise glucose checks will be done for 3 sessions at entry of program. Hunter Tran from 04/12/2020 in [facility]  Education need identified 04/12/20  Date 04/12/20  Educator Hunter Tran  Instruction Review Code 1- Verbalizes Understanding      Core Components/Risk Factors/Patient Goals Review:    Core Components/Risk Factors/Patient Goals at Discharge (Final Review):    ITP Comments:  ITP Comments    [place] Name 04/03/20 1615 04/12/20 1201         ITP Comments irtual orientation call completed today. he has an appointment on Date: 04/09/2020 for EP eval and gym Orientation.  Documentation of diagnosis can be found in media scan [facility] records  .   Perman is a current tobacco user. Intervention for tobacco cessation was provided at the initial medical review. He was asked about readiness to quit and reported he is ready to quit . Patient was advised and educated about tobacco cessation using combination therapy, tobacco cessation classes, quit line, and quit smoking apps. Patient demonstrated understanding of this material. Staff will continue to provide encouragement and follow up with the patient throughout the program. Completed 6MWT and gym orientation. Initial ITP created and sent for review to Dr. Ramonita Lab..             Comments: Initial ITP

## 2020-04-17 ENCOUNTER — Encounter: Payer: No Typology Code available for payment source | Admitting: *Deleted

## 2020-04-17 ENCOUNTER — Other Ambulatory Visit: Payer: Self-pay

## 2020-04-17 DIAGNOSIS — I509 Heart failure, unspecified: Secondary | ICD-10-CM | POA: Diagnosis present

## 2020-04-17 DIAGNOSIS — I5022 Chronic systolic (congestive) heart failure: Secondary | ICD-10-CM

## 2020-04-17 DIAGNOSIS — C159 Malignant neoplasm of esophagus, unspecified: Secondary | ICD-10-CM | POA: Diagnosis not present

## 2020-04-17 LAB — GLUCOSE, CAPILLARY
Glucose-Capillary: 155 mg/dL — ABNORMAL HIGH (ref 70–99)
Glucose-Capillary: 179 mg/dL — ABNORMAL HIGH (ref 70–99)

## 2020-04-17 NOTE — Progress Notes (Signed)
Daily Session Note  Patient Details  Name: Hunter Tran MRN: [id number] Date of Birth: December 07, 1955 Referring Provider:   Flowsheet Row Cardiac Rehab from 04/12/2020 in [facility] Cardiac [facility] Pul[facility] Rehab  Referring Provider [name] MD ([place])      Encounter Date: 04/17/2020  Check In:  Session Check In - 04/17/20 0931      Check-In   Supervising physician immediately available to respond to emergencies See telemetry face sheet for immediately available ER MD    Location [facility]-Cardiac & Pulmonary Rehab    Staff Present Heath Lark, RN, BSN, Jacklynn Bue, MS Exercise Physiologist;Amanda Oletta Darter, [place], ACSM CEP, Exercise Physiologist    Virtual Visit No    Medication changes reported     No    Fall or balance concerns reported    No    Warm-up and Cool-down Performed on first and last piece of equipment    Resistance Training Performed Yes    VAD Patient? No    PAD/SET Patient? No      Pain Assessment   Currently in Pain? No/denies              Social History   Tobacco Use  Smoking Status Current Some Day Smoker  . Packs/day: 1.00  . Years: 50.00  . Pack years: 50.00  . Types: Cigarettes  Smokeless Tobacco Never Used  Tobacco Comment   Quit date soon     Goals Met:  Exercise tolerated well Personal goals reviewed No report of cardiac concerns or symptoms  Goals Unmet:  Not Applicable  Comments: First full day of exercise!  Patient was oriented to gym and equipment including functions, settings, policies, and procedures.  Patient's individual exercise prescription and treatment plan were reviewed.  All starting workloads were established based on the results of the 6 minute walk test done at initial orientation visit.  The plan for exercise progression was also introduced and progression will be customized based on patient's performance and goals.    Dr. Emily Filbert is Medical Director for [place] and [facility].

## 2020-04-18 ENCOUNTER — Encounter: Payer: Self-pay | Admitting: *Deleted

## 2020-04-18 DIAGNOSIS — I5022 Chronic systolic (congestive) heart failure: Secondary | ICD-10-CM

## 2020-04-18 NOTE — Progress Notes (Signed)
Cardiac Individual Treatment Plan  Patient Details  Name: Hunter Tran MRN: [id number] Date of Birth: 1956-03-19 Referring Provider:   Flowsheet Row Cardiac Rehab from 04/12/2020 in [facility] Cardiac and Pulmonary Rehab  Referring Provider [name] MD ([place])      Initial Encounter Date:  Flowsheet Row Cardiac Rehab from 04/12/2020 in [facility] Cardiac and Pulmonary Rehab  Date 04/12/20      Visit Diagnosis: Heart failure, chronic systolic ([place])  Patient's Home Medications on Admission:  Current Outpatient Medications:  .  amLODipine (NORVASC) 10 MG tablet, Take 5 mg by mouth daily. Take 1/2 of 10 mg tab, Disp: , Rfl:  .  apixaban (ELIQUIS) 5 MG TABS tablet, Take 5 mg by mouth 2 (two) times daily. On hold until surgery, Disp: , Rfl:  .  ascorbic acid (VITAMIN C) 500 MG tablet, Take 500 mg by mouth 2 (two) times daily., Disp: , Rfl:  .  atorvastatin (LIPITOR) 80 MG tablet, Take 40 mg by mouth daily., Disp: , Rfl:  .  empagliflozin (JARDIANCE) 25 MG TABS tablet, Take 25 mg by mouth daily. Take one half tablet daily, Disp: , Rfl:  .  ferrous sulfate 325 (65 FE) MG tablet, Take 325 mg by mouth daily with breakfast., Disp: , Rfl:  .  insulin glargine (LANTUS) 100 UNIT/ML injection, Inject 50 Units into the skin daily., Disp: , Rfl:  .  liraglutide (VICTOZA) 18 MG/3ML SOPN, Inject 1.2 mg into the skin daily., Disp: , Rfl:  .  metFORMIN (GLUCOPHAGE) 1000 MG tablet, Take 1,000 mg by mouth 2 (two) times daily with a meal., Disp: , Rfl:  .  metoprolol (TOPROL-XL) 200 MG 24 hr tablet, Take 200 mg by mouth daily., Disp: , Rfl:  .  nicotine (NICODERM CQ - DOSED IN MG/24 HOURS) 14 mg/24hr patch, Place 14 mg onto the skin daily., Disp: , Rfl:  .  nicotine (NICODERM CQ - DOSED IN MG/24 HR) 7 mg/24hr patch, Place 7 mg onto the skin daily. Use after 10m patch completed, Disp: , Rfl:  .  nicotine polacrilex (NICORETTE) 2 MG gum, Take 2 mg by mouth as needed for smoking cessation., Disp: , Rfl:  .  omeprazole  (PRILOSEC) 20 MG capsule, Take 20 mg by mouth 2 (two) times daily., Disp: , Rfl:  .  torsemide (DEMADEX) 20 MG tablet, Take 20 mg by mouth daily., Disp: , Rfl:  .  valsartan (DIOVAN) 320 MG tablet, Take 320 mg by mouth daily., Disp: , Rfl:   Past Medical History: No past medical history on file.  Tobacco Use: Social History   Tobacco Use  Smoking Status Current Some Day Smoker  . Packs/day: 1.00  . Years: 50.00  . Pack years: 50.00  . Types: Cigarettes  Smokeless Tobacco Never Used  Tobacco Comment   Quit date soon     Labs: Recent Review Flowsheet Data   There is no flowsheet data to display.      Exercise Target Goals: Exercise Program Goal: Individual exercise prescription set using results from initial 6 min walk test and THRR while considering  patient's activity barriers and safety.   Exercise Prescription Goal: Initial exercise prescription builds to 30-45 minutes a day of aerobic activity, 2-3 days per week.  Home exercise guidelines will be given to patient during program as part of exercise prescription that the participant will acknowledge.   Education: Aerobic Exercise: - Group verbal and visual presentation on the components of exercise prescription. Introduces F.I.T.T principle from ACSM for exercise  prescriptions.  Reviews F.I.T.T. principles of aerobic exercise including progression. Written material given at graduation.   Education: Resistance Exercise: - Group verbal and visual presentation on the components of exercise prescription. Introduces F.I.T.T principle from ACSM for exercise prescriptions  Reviews F.I.T.T. principles of resistance exercise including progression. Written material given at graduation.    Education: Exercise & Equipment Safety: - Individual verbal instruction and demonstration of equipment use and safety with use of the equipment. Flowsheet Row Cardiac Rehab from 04/12/2020 in [facility] Cardiac and Pulmonary Rehab  Education need  identified 04/12/20  Date 04/12/20  Educator [place]  Instruction Review Code 1- Verbalizes Understanding      Education: Exercise Physiology & General Exercise Guidelines: - Group verbal and written instruction with models to review the exercise physiology of the cardiovascular system and associated critical values. Provides general exercise guidelines with specific guidelines to those with heart or lung disease.    Education: Flexibility, Balance, Mind/Body Relaxation: - Group verbal and visual presentation with interactive activity on the components of exercise prescription. Introduces F.I.T.T principle from ACSM for exercise prescriptions. Reviews F.I.T.T. principles of flexibility and balance exercise training including progression. Also discusses the mind body connection.  Reviews various relaxation techniques to help reduce and manage stress (i.e. Deep breathing, progressive muscle relaxation, and visualization). Balance handout provided to take home. Written material given at graduation.   Activity Barriers & Risk Stratification:  Activity Barriers & Cardiac Risk Stratification - 04/12/20 1350      Activity Barriers & Cardiac Risk Stratification   Activity Barriers Arthritis    Cardiac Risk Stratification High           6 Minute Walk:  6 Minute Walk    Row Name 04/12/20 1349         6 Minute Walk   Phase Initial     Distance 1180 feet     Walk Time 6 minutes     # of Rest Breaks 0     MPH 2.23     METS 2.7     RPE 7     Perceived Dyspnea  0     VO2 Peak 9.45     Symptoms No     Resting HR 80 bpm     Resting BP 116/64     Resting Oxygen Saturation  97 %     Exercise Oxygen Saturation  during 6 min walk 93 %     Max Ex. HR 91 bpm     Max Ex. BP 140/66     2 Minute Post BP 122/66            Oxygen Initial Assessment:   Oxygen Re-Evaluation:   Oxygen Discharge (Final Oxygen Re-Evaluation):   Initial Exercise Prescription:  Initial Exercise Prescription  - 04/12/20 1300      Date of Initial Exercise RX and Referring Provider   Date 04/12/20    Referring Provider [name] MD ([place])      Treadmill   MPH 2.3    Grade 0.5    Minutes 15    METs 2.92      Recumbant Bike   Level 3    RPM 60    Watts 20    Minutes 15    METs 2.7      NuStep   Level 2    SPM 80    Minutes 15    METs 2.7      T5 Nustep   Level 2  SPM 80    Minutes 15    METs 2.7      Prescription Details   Frequency (times per week) 2    Duration Progress to 30 minutes of continuous aerobic without signs/symptoms of physical distress      Intensity   THRR 40-80% of Max Heartrate 110-141    Ratings of Perceived Exertion 11-13    Perceived Dyspnea 0-4      Progression   Progression Continue to progress workloads to maintain intensity without signs/symptoms of physical distress.      Resistance Training   Training Prescription Yes    Weight 3 lb    Reps 10-15           Perform Capillary Blood Glucose checks as needed.  Exercise Prescription Changes:  Exercise Prescription Changes    Row Name 04/12/20 1400             Response to Exercise   Blood Pressure (Admit) 116/64       Blood Pressure (Exercise) 140/66       Blood Pressure (Exit) 122/66       Heart Rate (Admit) 80 bpm       Heart Rate (Exercise) 91 bpm       Heart Rate (Exit) 81 bpm       Oxygen Saturation (Admit) 97 %       Oxygen Saturation (Exercise) 93 %       Oxygen Saturation (Exit) 97 %       Rating of Perceived Exertion (Exercise) 7       Perceived Dyspnea (Exercise) 0       Symptoms none       Comments walk test results               Resistance Training   Training Prescription Yes       Weight 3 lb       Reps 10-15               Treadmill   MPH 2.3       Grade 0.5       Minutes 15       METs 2.92               Recumbant Bike   Level 3       RPM 60       Watts 20       Minutes 15       METs 2.7               NuStep   Level 2       SPM 80        Minutes 15       METs 2.7               T5 Nustep   Level 2       SPM 80       Minutes 15       METs 2.7              Exercise Comments:  Exercise Comments    Row Name 04/17/20 0931           Exercise Comments First full day of exercise!  Patient was oriented to gym and equipment including functions, settings, policies, and procedures.  Patient's individual exercise prescription and treatment plan were reviewed.  All starting workloads were established based on the results of the 6 minute walk test done at initial orientation  visit.  The plan for exercise progression was also introduced and progression will be customized based on patient's performance and goals.              Exercise Goals and Review:  Exercise Goals    Row Name 04/12/20 1406             Exercise Goals   Increase Physical Activity Yes       Intervention Provide advice, education, support and counseling about physical activity/exercise needs.;Develop an individualized exercise prescription for aerobic and resistive training based on initial evaluation findings, risk stratification, comorbidities and participant's personal goals.       Expected Outcomes Short Term: Attend rehab on a regular basis to increase amount of physical activity.;Long Term: Add in home exercise to make exercise part of routine and to increase amount of physical activity.;Long Term: Exercising regularly at least 3-5 days a week.       Increase Strength and Stamina Yes       Intervention Provide advice, education, support and counseling about physical activity/exercise needs.;Develop an individualized exercise prescription for aerobic and resistive training based on initial evaluation findings, risk stratification, comorbidities and participant's personal goals.       Expected Outcomes Short Term: Increase workloads from initial exercise prescription for resistance, speed, and METs.;Short Term: Perform resistance training exercises  routinely during rehab and add in resistance training at home;Long Term: Improve cardiorespiratory fitness, muscular endurance and strength as measured by increased METs and functional capacity (6MWT)       Able to understand and use rate of perceived exertion (RPE) scale Yes       Intervention Provide education and explanation on how to use RPE scale       Expected Outcomes Short Term: Able to use RPE daily in rehab to express subjective intensity level;Long Term:  Able to use RPE to guide intensity level when exercising independently       Able to understand and use Dyspnea scale Yes       Intervention Provide education and explanation on how to use Dyspnea scale       Expected Outcomes Short Term: Able to use Dyspnea scale daily in rehab to express subjective sense of shortness of breath during exertion;Long Term: Able to use Dyspnea scale to guide intensity level when exercising independently       Knowledge and understanding of Target Heart Rate Range (THRR) Yes       Intervention Provide education and explanation of THRR including how the numbers were predicted and where they are located for reference       Expected Outcomes Short Term: Able to state/look up THRR;Short Term: Able to use daily as guideline for intensity in rehab;Long Term: Able to use THRR to govern intensity when exercising independently       Able to check pulse independently Yes       Intervention Provide education and demonstration on how to check pulse in carotid and radial arteries.;Review the importance of being able to check your own pulse for safety during independent exercise       Expected Outcomes Short Term: Able to explain why pulse checking is important during independent exercise;Long Term: Able to check pulse independently and accurately       Understanding of Exercise Prescription Yes       Intervention Provide education, explanation, and written materials on patient's individual exercise prescription        Expected Outcomes Short Term: Able to explain program  exercise prescription;Long Term: Able to explain home exercise prescription to exercise independently              Exercise Goals Re-Evaluation :  Exercise Goals Re-Evaluation    Row Name 04/17/20 0932             Exercise Goal Re-Evaluation   Exercise Goals Review Able to understand and use rate of perceived exertion (RPE) scale;Able to understand and use Dyspnea scale;Knowledge and understanding of Target Heart Rate Range (THRR);Understanding of Exercise Prescription       Comments Reviewed RPE and dyspnea scales, THR and program prescription with pt today.  Pt voiced understanding and was given a copy of goals to take home.       Expected Outcomes Short: Use RPE daily to regulate intensity. Long: Follow program prescription in THR.              Discharge Exercise Prescription (Final Exercise Prescription Changes):  Exercise Prescription Changes - 04/12/20 1400      Response to Exercise   Blood Pressure (Admit) 116/64    Blood Pressure (Exercise) 140/66    Blood Pressure (Exit) 122/66    Heart Rate (Admit) 80 bpm    Heart Rate (Exercise) 91 bpm    Heart Rate (Exit) 81 bpm    Oxygen Saturation (Admit) 97 %    Oxygen Saturation (Exercise) 93 %    Oxygen Saturation (Exit) 97 %    Rating of Perceived Exertion (Exercise) 7    Perceived Dyspnea (Exercise) 0    Symptoms none    Comments walk test results      Resistance Training   Training Prescription Yes    Weight 3 lb    Reps 10-15      Treadmill   MPH 2.3    Grade 0.5    Minutes 15    METs 2.92      Recumbant Bike   Level 3    RPM 60    Watts 20    Minutes 15    METs 2.7      NuStep   Level 2    SPM 80    Minutes 15    METs 2.7      T5 Nustep   Level 2    SPM 80    Minutes 15    METs 2.7           Nutrition:  Target Goals: Understanding of nutrition guidelines, daily intake of sodium 1500mg , cholesterol 200mg , calories 30% from fat and  7% or less from saturated fats, daily to have 5 or more servings of fruits and vegetables.  Education: All About Nutrition: -Group instruction provided by verbal, written material, interactive activities, discussions, models, and posters to present general guidelines for heart healthy nutrition including fat, fiber, MyPlate, the role of sodium in heart healthy nutrition, utilization of the nutrition label, and utilization of this knowledge for meal planning. Follow up email sent as well. Written material given at graduation.   Biometrics:  Pre Biometrics - 04/12/20 1407      Pre Biometrics   Height 5' 10.25" (1.784 m)    Weight 236 lb 11.2 oz (107.4 kg)    BMI (Calculated) 33.73    Single Leg Stand 6.28 seconds            Nutrition Therapy Plan and Nutrition Goals:   Nutrition Assessments:  MEDIFICTS Score Key:  ?70 Need to make dietary changes   40-70 Heart Healthy Diet  ?  40 Therapeutic Level Cholesterol Diet  Flowsheet Row Cardiac Rehab from 04/17/2020 in [facility] Cardiac and Pulmonary Rehab  Picture Your Plate Total Score on Admission 45     Picture Your Plate Scores:  <29 Unhealthy dietary pattern with much room for improvement.  41-50 Dietary pattern unlikely to meet recommendations for good health and room for improvement.  51-60 More healthful dietary pattern, with some room for improvement.   >60 Healthy dietary pattern, although there may be some specific behaviors that could be improved.    Nutrition Goals Re-Evaluation:   Nutrition Goals Discharge (Final Nutrition Goals Re-Evaluation):   Psychosocial: Target Goals: Acknowledge presence or absence of significant depression and/or stress, maximize coping skills, provide positive support system. Participant is able to verbalize types and ability to use techniques and skills needed for reducing stress and depression.   Education: Stress, Anxiety, and Depression - Group verbal and visual presentation to  define topics covered.  Reviews how body is impacted by stress, anxiety, and depression.  Also discusses healthy ways to reduce stress and to treat/manage anxiety and depression.  Written material given at graduation.   Education: Sleep Hygiene -Provides group verbal and written instruction about how sleep can affect your health.  Define sleep hygiene, discuss sleep cycles and impact of sleep habits. Review good sleep hygiene tips.    Initial Review & Psychosocial Screening:  Initial Psych Review & Screening - 04/03/20 1553      Initial Review   Current issues with None Identified      Family Dynamics   Good Support System? Yes   cousin, friends, lots of kinfolks     Barriers   Psychosocial barriers to participate in program There are no identifiable barriers or psychosocial needs.;The patient should benefit from training in stress management and relaxation.      Screening Interventions   Interventions Encouraged to exercise;To provide support and resources with identified psychosocial needs;Provide feedback about the scores to participant    Expected Outcomes Short Term goal: Utilizing psychosocial counselor, staff and physician to assist with identification of specific Stressors or current issues interfering with healing process. Setting desired goal for each stressor or current issue identified.;Long Term Goal: Stressors or current issues are controlled or eliminated.;Short Term goal: Identification and review with participant of any Quality of Life or Depression concerns found by scoring the questionnaire.;Long Term goal: The participant improves quality of Life and PHQ9 Scores as seen by post scores and/or verbalization of changes           Quality of Life Scores:   Quality of Life - 04/12/20 1130      Quality of Life   Select Quality of Life      Quality of Life Scores   Health/Function Pre 16.33 %    Socioeconomic Pre 18.25 %    Psych/Spiritual Pre 18.43 %    Family Pre  16.25 %    GLOBAL Pre 17.21 %          Scores of 19 and below usually indicate a poorer quality of life in these areas.  A difference of  2-3 points is a clinically meaningful difference.  A difference of 2-3 points in the total score of the Quality of Life Index has been associated with significant improvement in overall quality of life, self-image, physical symptoms, and general health in studies assessing change in quality of life.  PHQ-9: Recent Review Flowsheet Data    Depression screen [facility] 2/9 04/12/2020   Decreased Interest 0  Down, Depressed, Hopeless 0   PHQ - 2 Score 0   Altered sleeping 0   Tired, decreased energy 1   Change in appetite 0   Feeling bad or failure about yourself  0   Trouble concentrating 0   Moving slowly or fidgety/restless 0   Suicidal thoughts 0   PHQ-9 Score 1   Difficult doing work/chores Not difficult at all     Interpretation of Total Score  Total Score Depression Severity:  1-4 = Minimal depression, 5-9 = Mild depression, 10-14 = Moderate depression, 15-19 = Moderately severe depression, 20-27 = Severe depression   Psychosocial Evaluation and Intervention:  Psychosocial Evaluation - 04/03/20 1608      Psychosocial Evaluation & Interventions   Interventions Encouraged to exercise with the program and follow exercise prescription    Comments Hunter Tran is ready to attend the prgram without any barriers. He lives alone and has no needs. He spends his time watching TV or sitting on the porch watching the cars go by. He has friends that drop in to see him. He is trying to Quit tobacco for an upcoming surgery. He has quit before and feels he can do this. He has nicotine patches and gum being sent to him from the [place]. He should do well with the program, as he is looking forward to getting stronger and quitting tobacco.    Expected Outcomes STG; Hunter Tran attends all session scheduled LTG: Hunter Tran is able to accomplish tobacco cessation and then maintain it after  discharge    Continue Psychosocial Services  Follow up required by staff           Psychosocial Re-Evaluation:   Psychosocial Discharge (Final Psychosocial Re-Evaluation):   Vocational Rehabilitation: Provide vocational rehab assistance to qualifying candidates.   Vocational Rehab Evaluation & Intervention:  Vocational Rehab - 04/03/20 1555      Initial Vocational Rehab Evaluation & Intervention   Assessment shows need for Vocational Rehabilitation No           Education: Education Goals: Education classes will be provided on a variety of topics geared toward better understanding of heart health and risk factor modification. Participant will state understanding/return demonstration of topics presented as noted by education test scores.  Learning Barriers/Preferences:  Learning Barriers/Preferences - 04/03/20 1554      Learning Barriers/Preferences   Learning Barriers None    Learning Preferences None           General Cardiac Education Topics:  AED/CPR: - Group verbal and written instruction with the use of models to demonstrate the basic use of the AED with the basic ABC's of resuscitation.   Anatomy and Cardiac Procedures: - Group verbal and visual presentation and models provide information about basic cardiac anatomy and function. Reviews the testing methods done to diagnose heart disease and the outcomes of the test results. Describes the treatment choices: Medical Management, Angioplasty, or Coronary Bypass Surgery for treating various heart conditions including Myocardial Infarction, Angina, Valve Disease, and Cardiac Arrhythmias.  Written material given at graduation.   Medication Safety: - Group verbal and visual instruction to review commonly prescribed medications for heart and lung disease. Reviews the medication, class of the drug, and side effects. Includes the steps to properly store meds and maintain the prescription regimen.  Written material given  at graduation.   Intimacy: - Group verbal instruction through game format to discuss how heart and lung disease can affect sexual intimacy. Written material given at graduation..   Know Your  Numbers and Heart Failure: - Group verbal and visual instruction to discuss disease risk factors for cardiac and pulmonary disease and treatment options.  Reviews associated critical values for Overweight/Obesity, Hypertension, Cholesterol, and Diabetes.  Discusses basics of heart failure: signs/symptoms and treatments.  Introduces Heart Failure Zone chart for action plan for heart failure.  Written material given at graduation.   Infection Prevention: - Provides verbal and written material to individual with discussion of infection control including proper hand washing and proper equipment cleaning during exercise session. Flowsheet Row Cardiac Rehab from 04/12/2020 in [facility] Cardiac and Pulmonary Rehab  Education need identified 04/12/20  Date 04/12/20  Educator [place]  Instruction Review Code 1- Verbalizes Understanding      Falls Prevention: - Provides verbal and written material to individual with discussion of falls prevention and safety. Flowsheet Row Cardiac Rehab from 04/03/2020 in [facility] Cardiac and Pulmonary Rehab  Date 04/03/20  Educator [place]  Instruction Review Code 1- Verbalizes Understanding      Other: -Provides group and verbal instruction on various topics (see comments)   Knowledge Questionnaire Score:  Knowledge Questionnaire Score - 04/12/20 1127      Knowledge Questionnaire Score   Pre Score 23/26: Angina, Heart disease, Exercise           Core Components/Risk Factors/Patient Goals at Admission:  Personal Goals and Risk Factors at Admission - 04/12/20 1408      Core Components/Risk Factors/Patient Goals on Admission    Weight Management Weight Loss    Intervention Weight Management: Develop a combined nutrition and exercise program designed to reach desired caloric  intake, while maintaining appropriate intake of nutrient and fiber, sodium and fats, and appropriate energy expenditure required for the weight goal.;Weight Management/Obesity: Establish reasonable short term and long term weight goals.    Admit Weight 236 lb (107 kg)    Goal Weight: Short Term 231 lb (104.8 kg)    Goal Weight: Long Term 190 lb (86.2 kg)    Expected Outcomes Short Term: Continue to assess and modify interventions until short term weight is achieved;Long Term: Adherence to nutrition and physical activity/exercise program aimed toward attainment of established weight goal;Weight Loss: Understanding of general recommendations for a balanced deficit meal plan, which promotes 1-2 lb weight loss per week and includes a negative energy balance of [phone] kcal/d;Understanding recommendations for meals to include 15-35% energy as protein, 25-35% energy from fat, 35-60% energy from carbohydrates, less than 200mg  of dietary cholesterol, 20-35 gm of total fiber daily;Understanding of distribution of calorie intake throughout the day with the consumption of 4-5 meals/snacks    Tobacco Cessation Yes   Trying the nicotine patches, does not have a quit date but thinking about quitting   Number of packs per day Hunter Tran is a current tobacco user. Intervention for tobacco cessation was provided at the initial medical review. He was asked about readiness to quit and reported he is ready to quit . Patient was advised and educated about tobacco cessation using combination therapy, tobacco cessation classes, quit line, and quit smoking apps. Patient demonstrated understanding of this material. Staff will continue to provide encouragement and follow up with the patient throughout the program. Hunter Tran currently smokes about 1/2 pack/day.    Intervention Assist the participant in steps to quit. Provide individualized education and counseling about committing to Tobacco Cessation, relapse prevention, and pharmacological  support that can be provided by physician.;Advice worker, assist with locating and accessing local/national Quit Smoking programs, and support quit date choice.  Expected Outcomes Short Term: Will demonstrate readiness to quit, by selecting a quit date.;Short Term: Will quit all tobacco product use, adhering to prevention of relapse plan.;Long Term: Complete abstinence from all tobacco products for at least 12 months from quit date.    Diabetes Yes    Intervention Provide education about signs/symptoms and action to take for hypo/hyperglycemia.;Provide education about proper nutrition, including hydration, and aerobic/resistive exercise prescription along with prescribed medications to achieve blood glucose in normal ranges: Fasting glucose 65-99 mg/dL    Expected Outcomes Short Term: Participant verbalizes understanding of the signs/symptoms and immediate care of hyper/hypoglycemia, proper foot care and importance of medication, aerobic/resistive exercise and nutrition plan for blood glucose control.;Long Term: Attainment of HbA1C < 7%.    Heart Failure Yes    Intervention Provide a combined exercise and nutrition program that is supplemented with education, support and counseling about heart failure. Directed toward relieving symptoms such as shortness of breath, decreased exercise tolerance, and extremity edema.    Expected Outcomes Improve functional capacity of life;Short term: Attendance in program 2-3 days a week with increased exercise capacity. Reported lower sodium intake. Reported increased fruit and vegetable intake. Reports medication compliance.;Short term: Daily weights obtained and reported for increase. Utilizing diuretic protocols set by physician.;Long term: Adoption of self-care skills and reduction of barriers for early signs and symptoms recognition and intervention leading to self-care maintenance.    Hypertension Yes    Intervention Provide education on lifestyle  modifcations including regular physical activity/exercise, weight management, moderate sodium restriction and increased consumption of fresh fruit, vegetables, and low fat dairy, alcohol moderation, and smoking cessation.;Monitor prescription use compliance.    Expected Outcomes Short Term: Continued assessment and intervention until BP is < 140/98mm HG in hypertensive participants. < 130/41mm HG in hypertensive participants with diabetes, heart failure or chronic kidney disease.;Long Term: Maintenance of blood pressure at goal levels.    Lipids Yes    Intervention Provide education and support for participant on nutrition & aerobic/resistive exercise along with prescribed medications to achieve LDL 70mg , HDL >40mg .    Expected Outcomes Short Term: Participant states understanding of desired cholesterol values and is compliant with medications prescribed. Participant is following exercise prescription and nutrition guidelines.;Long Term: Cholesterol controlled with medications as prescribed, with individualized exercise RX and with personalized nutrition plan. Value goals: LDL < 70mg , HDL > 40 mg.           Education:Diabetes - Individual verbal and written instruction to review signs/symptoms of diabetes, desired ranges of glucose level fasting, after meals and with exercise. Acknowledge that pre and post exercise glucose checks will be done for 3 sessions at entry of program. [place] from 04/12/2020 in [facility] Cardiac and Pul[facility] Rehab  Education need identified 04/12/20  Date 04/12/20  Educator [place]  Instruction Review Code 1- Verbalizes Understanding      Core Components/Risk Factors/Patient Goals Review:    Core Components/Risk Factors/Patient Goals at Discharge (Final Review):    ITP Comments:  ITP Comments    Row Name 04/03/20 1615 04/12/20 1201 04/17/20 0931 04/18/20 0959     ITP Comments irtual orientation call completed today. he has an appointment on Date:  04/09/2020 for EP eval and gym Orientation.  Documentation of diagnosis can be found in media scan [facility] records  .   Hunter Tran is a current tobacco user. Intervention for tobacco cessation was provided at the initial medical review. He was asked about readiness to quit and reported he is ready  to quit . Patient was advised and educated about tobacco cessation using combination therapy, tobacco cessation classes, quit line, and quit smoking apps. Patient demonstrated understanding of this material. Staff will continue to provide encouragement and follow up with the patient throughout the program. Completed 6MWT and gym orientation. Initial ITP created and sent for review to Dr. Ramonita Lab.. First full day of exercise!  Patient was oriented to gym and equipment including functions, settings, policies, and procedures.  Patient's individual exercise prescription and treatment plan were reviewed.  All starting workloads were established based on the results of the 6 minute walk test done at initial orientation visit.  The plan for exercise progression was also introduced and progression will be customized based on patient's performance and goals. 30 Day review completed. Medical Director ITP review done, changes made as directed, and signed approval by Medical Director.   New to program           Comments:

## 2020-04-19 ENCOUNTER — Other Ambulatory Visit: Payer: Self-pay

## 2020-04-19 DIAGNOSIS — I509 Heart failure, unspecified: Secondary | ICD-10-CM | POA: Diagnosis not present

## 2020-04-19 DIAGNOSIS — I5022 Chronic systolic (congestive) heart failure: Secondary | ICD-10-CM

## 2020-04-19 LAB — GLUCOSE, CAPILLARY
Glucose-Capillary: 218 mg/dL — ABNORMAL HIGH (ref 70–99)
Glucose-Capillary: 110 mg/dL — ABNORMAL HIGH (ref 70–99)

## 2020-04-19 NOTE — Progress Notes (Signed)
Daily Session Note  Patient Details  Name: Hunter Tran MRN: [id number] Date of Birth: 22-Jan-1956 Referring Provider:   Flowsheet Row Cardiac Rehab from 04/12/2020 in [facility] Cardiac and Pulmonary Rehab  Referring Provider [name] MD ([place])      Encounter Date: 04/19/2020  Check In:  Session Check In - 04/19/20 3167      Check-In   Supervising physician immediately available to respond to emergencies See telemetry face sheet for immediately available ER MD    Location [facility]-Cardiac & Pulmonary Rehab    Staff Present Birdie Sons, MPA, RN;Laureen Owens Shark, BS, RRT, CPFT;Kara Eliezer Bottom, MS Exercise Physiologist    Virtual Visit No    Medication changes reported     No    Fall or balance concerns reported    No    Warm-up and Cool-down Performed on first and last piece of equipment    Resistance Training Performed Yes    VAD Patient? No    PAD/SET Patient? No      Pain Assessment   Currently in Pain? No/denies              Social History   Tobacco Use  Smoking Status Current Some Day Smoker  . Packs/day: 1.00  . Years: 50.00  . Pack years: 50.00  . Types: Cigarettes  Smokeless Tobacco Never Used  Tobacco Comment   Quit date soon     Goals Met:  Independence with exercise equipment Exercise tolerated well No report of cardiac concerns or symptoms Strength training completed today  Goals Unmet:  Not Applicable  Comments: Pt able to follow exercise prescription today without complaint.  Will continue to monitor for progression.    Dr. Emily Filbert is Medical Director for [place] and [facility].

## 2020-04-23 ENCOUNTER — Telehealth: Payer: Self-pay

## 2020-04-23 NOTE — Telephone Encounter (Signed)
Called Hunter Tran for his nutrition appointment this morning, unable to complete call as phone was out of service - will reschedule.

## 2020-04-24 ENCOUNTER — Other Ambulatory Visit: Payer: Self-pay

## 2020-04-24 ENCOUNTER — Encounter: Payer: No Typology Code available for payment source | Attending: Internal Medicine | Admitting: *Deleted

## 2020-04-24 DIAGNOSIS — F17210 Nicotine dependence, cigarettes, uncomplicated: Secondary | ICD-10-CM | POA: Insufficient documentation

## 2020-04-24 DIAGNOSIS — I5022 Chronic systolic (congestive) heart failure: Secondary | ICD-10-CM | POA: Diagnosis present

## 2020-04-24 LAB — GLUCOSE, CAPILLARY
Glucose-Capillary: 286 mg/dL — ABNORMAL HIGH (ref 70–99)
Glucose-Capillary: 154 mg/dL — ABNORMAL HIGH (ref 70–99)

## 2020-04-24 NOTE — Progress Notes (Signed)
Daily Session Note  Patient Details  Name: Ean Gettel MRN: [id number] Date of Birth: 08/06/1955 Referring Provider:   Flowsheet Row Cardiac Rehab from 04/12/2020 in [facility] Cardiac and Pulmonary Rehab  Referring Provider [name] MD ([place])      Encounter Date: 04/24/2020  Check In:  Session Check In - 04/24/20 0917      Check-In   Supervising physician immediately available to respond to emergencies See telemetry face sheet for immediately available ER MD    Location [facility]-Cardiac & Pulmonary Rehab    Staff Present Birdie Sons, MPA, Nino Glow, MS Exercise Physiologist;Susanne Bice, RN, BSN, CCRP    Virtual Visit No    Medication changes reported     No    Fall or balance concerns reported    No    Tobacco Cessation Use Decreased    Current number of cigarettes/nicotine per day     6    Warm-up and Cool-down Performed on first and last piece of equipment    Resistance Training Performed Yes    VAD Patient? No    PAD/SET Patient? No      Pain Assessment   Currently in Pain? No/denies              Social History   Tobacco Use  Smoking Status Current Some Day Smoker  . Packs/day: 1.00  . Years: 50.00  . Pack years: 50.00  . Types: Cigarettes  Smokeless Tobacco Never Used  Tobacco Comment   Quit date soon     Goals Met:  Independence with exercise equipment Exercise tolerated well No report of cardiac concerns or symptoms  Goals Unmet:  Not Applicable  Comments: Pt able to follow exercise prescription today without complaint.  Will continue to monitor for progression.    Dr. Emily Filbert is Medical Director for [place] and [facility].

## 2020-04-25 ENCOUNTER — Other Ambulatory Visit: Payer: Self-pay

## 2020-04-25 DIAGNOSIS — I5022 Chronic systolic (congestive) heart failure: Secondary | ICD-10-CM

## 2020-04-25 NOTE — Progress Notes (Signed)
Completed initial RD evaluation 

## 2020-04-26 ENCOUNTER — Other Ambulatory Visit: Payer: Self-pay

## 2020-04-26 DIAGNOSIS — I5022 Chronic systolic (congestive) heart failure: Secondary | ICD-10-CM

## 2020-04-26 NOTE — Progress Notes (Signed)
Daily Session Note  Patient Details  Name: Gearald Stonebraker MRN: [id number] Date of Birth: 1955/12/31 Referring Provider:   Flowsheet Row Cardiac Rehab from 04/12/2020 in [facility] Cardiac and Pulmonary Rehab  Referring Provider [name] MD ([place])      Encounter Date: 04/26/2020  Check In:  Session Check In - 04/26/20 0929      Check-In   Supervising physician immediately available to respond to emergencies See telemetry face sheet for immediately available ER MD    Location [facility]-Cardiac & Pulmonary Rehab    Staff Present Birdie Sons, MPA, RN;Melissa Caiola RDN, Rowe Pavy, BA, ACSM CEP, Exercise Physiologist    Virtual Visit No    Medication changes reported     No    Fall or balance concerns reported    No    Tobacco Cessation No Change    Current number of cigarettes/nicotine per day     6    Warm-up and Cool-down Performed on first and last piece of equipment    Resistance Training Performed Yes    VAD Patient? No    PAD/SET Patient? No      Pain Assessment   Currently in Pain? No/denies              Social History   Tobacco Use  Smoking Status Current Some Day Smoker  . Packs/day: 1.00  . Years: 50.00  . Pack years: 50.00  . Types: Cigarettes  Smokeless Tobacco Never Used  Tobacco Comment   Quit date soon     Goals Met:  Independence with exercise equipment Exercise tolerated well Personal goals reviewed No report of cardiac concerns or symptoms Strength training completed today  Goals Unmet:  Not Applicable  Comments: Pt able to follow exercise prescription today without complaint.  Will continue to monitor for progression.    Dr. Emily Filbert is Medical Director for [place] and [facility].

## 2020-04-26 NOTE — Progress Notes (Signed)
Daily Session Note  Patient Details  Name: Hunter Tran MRN: [id number] Date of Birth: Apr 01, 1955 Referring Provider:   Flowsheet Row Cardiac Rehab from 04/12/2020 in [facility] Cardiac and Pulmonary Rehab  Referring Provider [name] MD ([place])      Encounter Date: 04/26/2020  Check In:  Session Check In - 04/26/20 0929      Check-In   Supervising physician immediately available to respond to emergencies See telemetry face sheet for immediately available ER MD    Location [facility]-Cardiac & Pulmonary Rehab    Staff Present Birdie Sons, MPA, RN;Melissa Caiola RDN, Rowe Pavy, BA, ACSM CEP, Exercise Physiologist    Virtual Visit No    Medication changes reported     No    Fall or balance concerns reported    No    Tobacco Cessation No Change    Current number of cigarettes/nicotine per day     6    Warm-up and Cool-down Performed on first and last piece of equipment    Resistance Training Performed Yes    VAD Patient? No    PAD/SET Patient? No      Pain Assessment   Currently in Pain? No/denies              Social History   Tobacco Use  Smoking Status Current Some Day Smoker  . Packs/day: 1.00  . Years: 50.00  . Pack years: 50.00  . Types: Cigarettes  Smokeless Tobacco Never Used  Tobacco Comment   Quit date soon     Goals Met:  Independence with exercise equipment Exercise tolerated well Personal goals reviewed No report of cardiac concerns or symptoms Strength training completed today  Goals Unmet:  Not Applicable  Comments: Pt able to follow exercise prescription today without complaint.  Will continue to monitor for progression. Reviewed home exercise with pt today.  Pt plans to walk for exercise.  Reviewed THR, pulse, RPE, sign and symptoms, pulse oximetery and when to call 911 or MD.  Also discussed weather considerations and indoor options.  Pt voiced understanding.    Dr. Emily Filbert is Medical Director for [place] and  [facility].

## 2020-05-03 ENCOUNTER — Other Ambulatory Visit: Payer: Self-pay

## 2020-05-03 DIAGNOSIS — I5022 Chronic systolic (congestive) heart failure: Secondary | ICD-10-CM | POA: Diagnosis not present

## 2020-05-03 NOTE — Progress Notes (Signed)
Daily Session Note  Patient Details  Name: Hunter Tran MRN: [id number] Date of Birth: [birth year] Referring Provider:   Flowsheet Row Cardiac Rehab from 04/12/2020 in [facility] Cardiac and Pulmonary Rehab  Referring Provider [name] MD ([place])      Encounter Date: 05/03/2020  Check In:  Session Check In - 05/03/20 0941      Check-In   Supervising physician immediately available to respond to emergencies See telemetry face sheet for immediately available ER MD    Location [facility]-Cardiac & Pulmonary Rehab    Staff Present Birdie Sons, MPA, RN;Melissa Caiola RDN, Rowe Pavy, BA, ACSM CEP, Exercise Physiologist    Virtual Visit No    Medication changes reported     No    Fall or balance concerns reported    No    Tobacco Cessation Use Decreased    Current number of cigarettes/nicotine per day     3    Warm-up and Cool-down Performed on first and last piece of equipment    Resistance Training Performed Yes    VAD Patient? No    PAD/SET Patient? No      Pain Assessment   Currently in Pain? No/denies              Social History   Tobacco Use  Smoking Status Current Some Day Smoker  . Packs/day: 1.00  . Years: 50.00  . Pack years: 50.00  . Types: Cigarettes  Smokeless Tobacco Never Used  Tobacco Comment   Quit date soon     Goals Met:  Independence with exercise equipment Exercise tolerated well No report of cardiac concerns or symptoms Strength training completed today  Goals Unmet:  Not Applicable  Comments: Pt able to follow exercise prescription today without complaint.  Will continue to monitor for progression.    Dr. Emily Filbert is Medical Director for [place] and [facility].

## 2020-05-08 ENCOUNTER — Other Ambulatory Visit: Payer: Self-pay

## 2020-05-08 ENCOUNTER — Encounter: Payer: No Typology Code available for payment source | Admitting: *Deleted

## 2020-05-08 DIAGNOSIS — I5022 Chronic systolic (congestive) heart failure: Secondary | ICD-10-CM | POA: Diagnosis not present

## 2020-05-08 NOTE — Progress Notes (Signed)
Daily Session Note  Patient Details  Name: Hunter Tran MRN: [id number] Date of Birth: 1955-10-30 Referring Provider:   Flowsheet Row Cardiac Rehab from 04/12/2020 in [facility] Cardiac and Pulmonary Rehab  Referring Provider [name] MD ([place])      Encounter Date: 05/08/2020  Check In:  Session Check In - 05/08/20 0932      Check-In   Supervising physician immediately available to respond to emergencies See telemetry face sheet for immediately available ER MD    Location [facility]-Cardiac & Pulmonary Rehab    Staff Present Heath Lark, RN, BSN, CCRP;Amanda Sommer, BA, ACSM CEP, Exercise Physiologist;Kara Eliezer Bottom, MS Exercise Physiologist    Virtual Visit No    Medication changes reported     No    Fall or balance concerns reported    No    Tobacco Cessation No Change    Current number of cigarettes/nicotine per day     3    Warm-up and Cool-down Performed on first and last piece of equipment    Resistance Training Performed Yes    VAD Patient? No    PAD/SET Patient? No      Pain Assessment   Currently in Pain? No/denies              Social History   Tobacco Use  Smoking Status Current Some Day Smoker  . Packs/day: 1.00  . Years: 50.00  . Pack years: 50.00  . Types: Cigarettes  Smokeless Tobacco Never Used  Tobacco Comment   Quit date soon     Goals Met:  Independence with exercise equipment Exercise tolerated well No report of cardiac concerns or symptoms  Goals Unmet:  Not Applicable  Comments: Pt able to follow exercise prescription today without complaint.  Will continue to monitor for progression.    Dr. Emily Filbert is Medical Director for [place] and [facility].

## 2020-05-10 ENCOUNTER — Other Ambulatory Visit: Payer: Self-pay

## 2020-05-10 DIAGNOSIS — I5022 Chronic systolic (congestive) heart failure: Secondary | ICD-10-CM

## 2020-05-10 NOTE — Progress Notes (Signed)
Daily Session Note  Patient Details  Name: Hunter Tran MRN: [id number] Date of Birth: [birth year] Referring Provider:   Flowsheet Row Cardiac Rehab from 04/12/2020 in [facility] Cardiac and Pulmonary Rehab  Referring Provider [name] MD ([place])      Encounter Date: 05/10/2020  Check In:  Session Check In - 05/10/20 0926      Check-In   Supervising physician immediately available to respond to emergencies See telemetry face sheet for immediately available ER MD    Location [facility]-Cardiac & Pulmonary Rehab    Staff Present Birdie Sons, MPA, RN;Melissa Caiola RDN, Rowe Pavy, BA, ACSM CEP, Exercise Physiologist    Virtual Visit No    Medication changes reported     No    Fall or balance concerns reported    No    Tobacco Cessation Use Decreased    Current number of cigarettes/nicotine per day     2    Warm-up and Cool-down Performed on first and last piece of equipment    Resistance Training Performed Yes    VAD Patient? No    PAD/SET Patient? No      Pain Assessment   Currently in Pain? No/denies              Social History   Tobacco Use  Smoking Status Current Some Day Smoker  . Packs/day: 1.00  . Years: 50.00  . Pack years: 50.00  . Types: Cigarettes  Smokeless Tobacco Never Used  Tobacco Comment   Quit date soon     Goals Met:  Independence with exercise equipment Exercise tolerated well No report of cardiac concerns or symptoms Strength training completed today  Goals Unmet:  Not Applicable  Comments: Pt able to follow exercise prescription today without complaint.  Will continue to monitor for progression.    Dr. Emily Filbert is Medical Director for [place] and [facility].

## 2020-05-15 ENCOUNTER — Other Ambulatory Visit: Payer: Self-pay

## 2020-05-15 DIAGNOSIS — I5022 Chronic systolic (congestive) heart failure: Secondary | ICD-10-CM

## 2020-05-15 NOTE — Progress Notes (Signed)
Daily Session Note  Patient Details  Name: Hunter Tran MRN: [id number] Date of Birth: 03/26/1955 Referring Provider:   Flowsheet Row Cardiac Rehab from 04/12/2020 in [facility] Cardiac and Pulmonary Rehab  Referring Provider [name] MD ([place])      Encounter Date: 05/15/2020  Check In:  Session Check In - 05/15/20 0917      Check-In   Supervising physician immediately available to respond to emergencies See telemetry face sheet for immediately available ER MD    Location [facility]-Cardiac & Pulmonary Rehab    Staff Present Birdie Sons, MPA, Elveria Rising, BA, ACSM CEP, Exercise Physiologist;Kara Eliezer Bottom, MS Exercise Physiologist    Virtual Visit No    Medication changes reported     No    Fall or balance concerns reported    No    Tobacco Cessation No Change    Warm-up and Cool-down Performed on first and last piece of equipment    Resistance Training Performed Yes    VAD Patient? No    PAD/SET Patient? No      Pain Assessment   Currently in Pain? No/denies              Social History   Tobacco Use  Smoking Status Current Some Day Smoker  . Packs/day: 1.00  . Years: 50.00  . Pack years: 50.00  . Types: Cigarettes  Smokeless Tobacco Never Used  Tobacco Comment   Quit date soon     Goals Met:  Independence with exercise equipment Exercise tolerated well No report of cardiac concerns or symptoms Strength training completed today  Goals Unmet:  Not Applicable  Comments: Pt able to follow exercise prescription today without complaint.  Will continue to monitor for progression.    Dr. Emily Filbert is Medical Director for [place] and [facility].

## 2020-05-16 ENCOUNTER — Encounter: Payer: Self-pay | Admitting: *Deleted

## 2020-05-16 DIAGNOSIS — I5022 Chronic systolic (congestive) heart failure: Secondary | ICD-10-CM

## 2020-05-16 NOTE — Progress Notes (Signed)
Cardiac Individual Treatment Plan  Patient Details  Name: Arlon Bleier MRN: [id number] Date of Birth: 1956-03-19 Referring Provider:   Flowsheet Row Cardiac Rehab from 04/12/2020 in [facility] Cardiac and Pulmonary Rehab  Referring Provider [name] MD ([place])      Initial Encounter Date:  Flowsheet Row Cardiac Rehab from 04/12/2020 in [facility] Cardiac and Pulmonary Rehab  Date 04/12/20      Visit Diagnosis: Heart failure, chronic systolic ([place])  Patient's Home Medications on Admission:  Current Outpatient Medications:  .  amLODipine (NORVASC) 10 MG tablet, Take 5 mg by mouth daily. Take 1/2 of 10 mg tab, Disp: , Rfl:  .  apixaban (ELIQUIS) 5 MG TABS tablet, Take 5 mg by mouth 2 (two) times daily. On hold until surgery, Disp: , Rfl:  .  ascorbic acid (VITAMIN C) 500 MG tablet, Take 500 mg by mouth 2 (two) times daily., Disp: , Rfl:  .  atorvastatin (LIPITOR) 80 MG tablet, Take 40 mg by mouth daily., Disp: , Rfl:  .  empagliflozin (JARDIANCE) 25 MG TABS tablet, Take 25 mg by mouth daily. Take one half tablet daily, Disp: , Rfl:  .  ferrous sulfate 325 (65 FE) MG tablet, Take 325 mg by mouth daily with breakfast., Disp: , Rfl:  .  insulin glargine (LANTUS) 100 UNIT/ML injection, Inject 50 Units into the skin daily., Disp: , Rfl:  .  liraglutide (VICTOZA) 18 MG/3ML SOPN, Inject 1.2 mg into the skin daily., Disp: , Rfl:  .  metFORMIN (GLUCOPHAGE) 1000 MG tablet, Take 1,000 mg by mouth 2 (two) times daily with a meal., Disp: , Rfl:  .  metoprolol (TOPROL-XL) 200 MG 24 hr tablet, Take 200 mg by mouth daily., Disp: , Rfl:  .  nicotine (NICODERM CQ - DOSED IN MG/24 HOURS) 14 mg/24hr patch, Place 14 mg onto the skin daily., Disp: , Rfl:  .  nicotine (NICODERM CQ - DOSED IN MG/24 HR) 7 mg/24hr patch, Place 7 mg onto the skin daily. Use after 10m patch completed, Disp: , Rfl:  .  nicotine polacrilex (NICORETTE) 2 MG gum, Take 2 mg by mouth as needed for smoking cessation., Disp: , Rfl:  .  omeprazole  (PRILOSEC) 20 MG capsule, Take 20 mg by mouth 2 (two) times daily., Disp: , Rfl:  .  torsemide (DEMADEX) 20 MG tablet, Take 20 mg by mouth daily., Disp: , Rfl:  .  valsartan (DIOVAN) 320 MG tablet, Take 320 mg by mouth daily., Disp: , Rfl:   Past Medical History: No past medical history on file.  Tobacco Use: Social History   Tobacco Use  Smoking Status Current Some Day Smoker  . Packs/day: 1.00  . Years: 50.00  . Pack years: 50.00  . Types: Cigarettes  Smokeless Tobacco Never Used  Tobacco Comment   Quit date soon     Labs: Recent Review Flowsheet Data   There is no flowsheet data to display.      Exercise Target Goals: Exercise Program Goal: Individual exercise prescription set using results from initial 6 min walk test and THRR while considering  patient's activity barriers and safety.   Exercise Prescription Goal: Initial exercise prescription builds to 30-45 minutes a day of aerobic activity, 2-3 days per week.  Home exercise guidelines will be given to patient during program as part of exercise prescription that the participant will acknowledge.   Education: Aerobic Exercise: - Group verbal and visual presentation on the components of exercise prescription. Introduces F.I.T.T principle from ACSM for exercise  prescriptions.  Reviews F.I.T.T. principles of aerobic exercise including progression. Written material given at graduation.   Education: Resistance Exercise: - Group verbal and visual presentation on the components of exercise prescription. Introduces F.I.T.T principle from ACSM for exercise prescriptions  Reviews F.I.T.T. principles of resistance exercise including progression. Written material given at graduation.    Education: Exercise & Equipment Safety: - Individual verbal instruction and demonstration of equipment use and safety with use of the equipment. Flowsheet Row Cardiac Rehab from 05/03/2020 in [facility] Cardiac and Pulmonary Rehab  Education need  identified 04/12/20  Date 04/12/20  Educator [place]  Instruction Review Code 1- Verbalizes Understanding      Education: Exercise Physiology & General Exercise Guidelines: - Group verbal and written instruction with models to review the exercise physiology of the cardiovascular system and associated critical values. Provides general exercise guidelines with specific guidelines to those with heart or lung disease.  Flowsheet Row Cardiac Rehab from 05/03/2020 in [facility] Cardiac and Pulmonary Rehab  Date 05/03/20  Educator [facility]  Instruction Review Code 1- Verbalizes Understanding      Education: Flexibility, Balance, Mind/Body Relaxation: - Group verbal and visual presentation with interactive activity on the components of exercise prescription. Introduces F.I.T.T principle from ACSM for exercise prescriptions. Reviews F.I.T.T. principles of flexibility and balance exercise training including progression. Also discusses the mind body connection.  Reviews various relaxation techniques to help reduce and manage stress (i.e. Deep breathing, progressive muscle relaxation, and visualization). Balance handout provided to take home. Written material given at graduation.   Activity Barriers & Risk Stratification:  Activity Barriers & Cardiac Risk Stratification - 04/12/20 1350      Activity Barriers & Cardiac Risk Stratification   Activity Barriers Arthritis    Cardiac Risk Stratification High           6 Minute Walk:  6 Minute Walk    Row Name 04/12/20 1349         6 Minute Walk   Phase Initial     Distance 1180 feet     Walk Time 6 minutes     # of Rest Breaks 0     MPH 2.23     METS 2.7     RPE 7     Perceived Dyspnea  0     VO2 Peak 9.45     Symptoms No     Resting HR 80 bpm     Resting BP 116/64     Resting Oxygen Saturation  97 %     Exercise Oxygen Saturation  during 6 min walk 93 %     Max Ex. HR 91 bpm     Max Ex. BP 140/66     2 Minute Post BP 122/66            Oxygen  Initial Assessment:   Oxygen Re-Evaluation:   Oxygen Discharge (Final Oxygen Re-Evaluation):   Initial Exercise Prescription:  Initial Exercise Prescription - 04/12/20 1300      Date of Initial Exercise RX and Referring Provider   Date 04/12/20    Referring Provider [name] MD ([facility])      Treadmill   MPH 2.3    Grade 0.5    Minutes 15    METs 2.92      Recumbant Bike   Level 3    RPM 60    Watts 20    Minutes 15    METs 2.7      NuStep   Level 2  SPM 80    Minutes 15    METs 2.7      T5 Nustep   Level 2    SPM 80    Minutes 15    METs 2.7      Prescription Details   Frequency (times per week) 2    Duration Progress to 30 minutes of continuous aerobic without signs/symptoms of physical distress      Intensity   THRR 40-80% of Max Heartrate 110-141    Ratings of Perceived Exertion 11-13    Perceived Dyspnea 0-4      Progression   Progression Continue to progress workloads to maintain intensity without signs/symptoms of physical distress.      Resistance Training   Training Prescription Yes    Weight 3 lb    Reps 10-15           Perform Capillary Blood Glucose checks as needed.  Exercise Prescription Changes:  Exercise Prescription Changes    Row Name 04/12/20 1400 04/25/20 1300 05/08/20 1500         Response to Exercise   Blood Pressure (Admit) 116/64 128/72 134/66     Blood Pressure (Exercise) 140/66 150/80 144/66     Blood Pressure (Exit) 122/66 130/72 132/68     Heart Rate (Admit) 80 bpm 75 bpm 88 bpm     Heart Rate (Exercise) 91 bpm 97 bpm 91 bpm     Heart Rate (Exit) 81 bpm 78 bpm 85 bpm     Oxygen Saturation (Admit) 97 % -- --     Oxygen Saturation (Exercise) 93 % -- --     Oxygen Saturation (Exit) 97 % -- --     Rating of Perceived Exertion (Exercise) _0 Perceived Dyspnea (Exercise) 0 -- --     Symptoms none none none     Comments walk test results second day --     Duration -- Progress to 30 minutes of  aerobic  without signs/symptoms of physical distress Continue with 30 min of aerobic exercise without signs/symptoms of physical distress.     Intensity -- THRR unchanged THRR unchanged           Progression   Progression -- Continue to progress workloads to maintain intensity without signs/symptoms of physical distress. Continue to progress workloads to maintain intensity without signs/symptoms of physical distress.     Average METs -- 2.7 2.8           Resistance Training   Training Prescription Yes Yes Yes     Weight 3 lb 3 lb 4 lb     Reps 10-15 10-15 10-15           Interval Training   Interval Training -- -- No           Treadmill   MPH 2.3 -- 1.7     Grade 0.5 -- 0     Minutes 15 -- 15     METs 2.92 -- 2.3           Recumbant Bike   Level _1 RPM 60 60 --     Watts 20 29 32     Minutes _2 METs 2.7 2.86 2.9           NuStep   Level _3 SPM 80 80 --     Minutes 15 15 15  METs 2.7 2.3 3.3           T5 Nustep   Level 2 -- 3     SPM 80 -- --     Minutes 15 -- 15     METs 2.7 -- 2.7           Home Exercise Plan   Plans to continue exercise at -- -- [facility]  and walking at home     Frequency -- -- Add 2 additional days to program exercise sessions.     Initial Home Exercises Provided -- -- 04/26/20            Exercise Comments:  Exercise Comments    Row Name 04/17/20 0931           Exercise Comments First full day of exercise!  Patient was oriented to gym and equipment including functions, settings, policies, and procedures.  Patient's individual exercise prescription and treatment plan were reviewed.  All starting workloads were established based on the results of the 6 minute walk test done at initial orientation visit.  The plan for exercise progression was also introduced and progression will be customized based on patient's performance and goals.              Exercise Goals and Review:  Exercise Goals    Row Name  04/12/20 1406             Exercise Goals   Increase Physical Activity Yes       Intervention Provide advice, education, support and counseling about physical activity/exercise needs.;Develop an individualized exercise prescription for aerobic and resistive training based on initial evaluation findings, risk stratification, comorbidities and participant's personal goals.       Expected Outcomes Short Term: Attend rehab on a regular basis to increase amount of physical activity.;Long Term: Add in home exercise to make exercise part of routine and to increase amount of physical activity.;Long Term: Exercising regularly at least 3-5 days a week.       Increase Strength and Stamina Yes       Intervention Provide advice, education, support and counseling about physical activity/exercise needs.;Develop an individualized exercise prescription for aerobic and resistive training based on initial evaluation findings, risk stratification, comorbidities and participant's personal goals.       Expected Outcomes Short Term: Increase workloads from initial exercise prescription for resistance, speed, and METs.;Short Term: Perform resistance training exercises routinely during rehab and add in resistance training at home;Long Term: Improve cardiorespiratory fitness, muscular endurance and strength as measured by increased METs and functional capacity (6MWT)       Able to understand and use rate of perceived exertion (RPE) scale Yes       Intervention Provide education and explanation on how to use RPE scale       Expected Outcomes Short Term: Able to use RPE daily in rehab to express subjective intensity level;Long Term:  Able to use RPE to guide intensity level when exercising independently       Able to understand and use Dyspnea scale Yes       Intervention Provide education and explanation on how to use Dyspnea scale       Expected Outcomes Short Term: Able to use Dyspnea scale daily in rehab to express  subjective sense of shortness of breath during exertion;Long Term: Able to use Dyspnea scale to guide intensity level when exercising independently       Knowledge and understanding of Target Heart Rate  Range (THRR) Yes       Intervention Provide education and explanation of THRR including how the numbers were predicted and where they are located for reference       Expected Outcomes Short Term: Able to state/look up THRR;Short Term: Able to use daily as guideline for intensity in rehab;Long Term: Able to use THRR to govern intensity when exercising independently       Able to check pulse independently Yes       Intervention Provide education and demonstration on how to check pulse in carotid and radial arteries.;Review the importance of being able to check your own pulse for safety during independent exercise       Expected Outcomes Short Term: Able to explain why pulse checking is important during independent exercise;Long Term: Able to check pulse independently and accurately       Understanding of Exercise Prescription Yes       Intervention Provide education, explanation, and written materials on patient's individual exercise prescription       Expected Outcomes Short Term: Able to explain program exercise prescription;Long Term: Able to explain home exercise prescription to exercise independently              Exercise Goals Re-Evaluation :  Exercise Goals Re-Evaluation    Row Name 04/17/20 0932 04/25/20 1345 04/26/20 0945 05/08/20 1549       Exercise Goal Re-Evaluation   Exercise Goals Review Able to understand and use rate of perceived exertion (RPE) scale;Able to understand and use Dyspnea scale;Knowledge and understanding of Target Heart Rate Range (THRR);Understanding of Exercise Prescription Increase Physical Activity;Increase Strength and Stamina Increase Physical Activity;Increase Strength and Stamina;Able to understand and use rate of perceived exertion (RPE) scale;Knowledge and  understanding of Target Heart Rate Range (THRR);Able to check pulse independently Increase Physical Activity;Increase Strength and Stamina;Understanding of Exercise Prescription    Comments Reviewed RPE and dyspnea scales, THR and program prescription with pt today.  Pt voiced understanding and was given a copy of goals to take home. Vernis is off to a good start in rehab.  We will continue to monitor progress. Reviewed home exercise with pt today.  Pt plans to walk for exercise.  Reviewed THR, pulse, RPE, sign and symptoms, pulse oximetery and when to call 911 or MD.  Also discussed weather considerations and indoor options.  Pt voiced understanding. Tanay is doing well in rehab.  He is up to 32 watts on the bike.  We will continue to monitor his progress.    Expected Outcomes Short: Use RPE daily to regulate intensity. Long: Follow program prescription in THR. Short: attend rehab consistently Long:  improve overall stamina Short: practice mointoring HR whil exercising Long:  increase MET level Short: Increase workload on treadmill Long: Continue to improve stamina.           Discharge Exercise Prescription (Final Exercise Prescription Changes):  Exercise Prescription Changes - 05/08/20 1500      Response to Exercise   Blood Pressure (Admit) 134/66    Blood Pressure (Exercise) 144/66    Blood Pressure (Exit) 132/68    Heart Rate (Admit) 88 bpm    Heart Rate (Exercise) 91 bpm    Heart Rate (Exit) 85 bpm    Rating of Perceived Exertion (Exercise) 15    Symptoms none    Duration Continue with 30 min of aerobic exercise without signs/symptoms of physical distress.    Intensity THRR unchanged      Progression   Progression Continue  to progress workloads to maintain intensity without signs/symptoms of physical distress.    Average METs 2.8      Resistance Training   Training Prescription Yes    Weight 4 lb    Reps 10-15      Interval Training   Interval Training No      Treadmill   MPH  1.7    Grade 0    Minutes 15    METs 2.3      Recumbant Bike   Level 4    Watts 32    Minutes 15    METs 2.9      NuStep   Level 4    Minutes 15    METs 3.3      T5 Nustep   Level 3    Minutes 15    METs 2.7      Home Exercise Plan   Plans to continue exercise at [facility]   and walking at home   Frequency Add 2 additional days to program exercise sessions.    Initial Home Exercises Provided 04/26/20           Nutrition:  Target Goals: Understanding of nutrition guidelines, daily intake of sodium <1514m, cholesterol <2022m calories 30% from fat and 7% or less from saturated fats, daily to have 5 or more servings of fruits and vegetables.  Education: All About Nutrition: -Group instruction provided by verbal, written material, interactive activities, discussions, models, and posters to present general guidelines for heart healthy nutrition including fat, fiber, MyPlate, the role of sodium in heart healthy nutrition, utilization of the nutrition label, and utilization of this knowledge for meal planning. Follow up email sent as well. Written material given at graduation.   Biometrics:  Pre Biometrics - 04/12/20 1407      Pre Biometrics   Height 5' 10.25" (1.784 m)    Weight 236 lb 11.2 oz (107.4 kg)    BMI (Calculated) 33.73    Single Leg Stand 6.28 seconds            Nutrition Therapy Plan and Nutrition Goals:  Nutrition Therapy & Goals - 04/25/20 0846      Nutrition Therapy   Diet Heart healthy, low Na    Drug/Food Interactions Statins/Certain Fruits    Protein (specify units) 85g    Fiber 30 grams    Whole Grain Foods 3 servings    Saturated Fats 12 max. grams    Fruits and Vegetables 8 servings/day    Sodium 1.5 grams      Personal Nutrition Goals   Nutrition Goal ST: for lunch have tuna or chicken salad instead of fast food LT: limit saturated fat to <12g/day    Comments Coffee (equal and creamer) B: eggs with bacon, sausage, or ham L:  take out (fast food) - burger D: frozen dinner  or cook (2x/week - he also eats leftovers) chicken and pork, spaghetti, soup, sometimes shrimp or fish. He has potatoes, peas, carrots, fried onions, can of mushrooms, beans (all), greens. He does not cook with salt, he adds a bit a sea salt at the end, but not always. He uses vegetable oil, he uses butter with grits. Drinks: water S: pork rinds and potato chips. He doesn't eat bread because [facility] not to and now he eats saltines. He tries to keep fruit like grapes and oranges. His teeth prevent him from having anything too hard. Discussed heart healthy eating. Discussed whole grains and suggested whole  grain crackers instead of saltines if he doesn't care for bread. Suggested replacing lunch fast food with something prepared at home. Suggested limiting processed meat and pork rinds. Suggested more variety in vegetables (more non-starchy).      Intervention Plan   Intervention Prescribe, educate and counsel regarding individualized specific dietary modifications aiming towards targeted core components such as weight, hypertension, lipid management, diabetes, heart failure and other comorbidities.;Nutrition handout(s) given to patient.    Expected Outcomes Short Term Goal: Understand basic principles of dietary content, such as calories, fat, sodium, cholesterol and nutrients.;Short Term Goal: A plan has been developed with personal nutrition goals set during dietitian appointment.;Long Term Goal: Adherence to prescribed nutrition plan.           Nutrition Assessments:  MEDIFICTS Score Key:  ?70 Need to make dietary changes   40-70 Heart Healthy Diet  ? 40 Therapeutic Level Cholesterol Diet  Flowsheet Row Cardiac Rehab from 04/17/2020 in [facility] Cardiac and Pulmonary Rehab  Picture Your Plate Total Score on Admission 45     Picture Your Plate Scores:  <24 Unhealthy dietary pattern with much room for improvement.  41-50 Dietary pattern  unlikely to meet recommendations for good health and room for improvement.  51-60 More healthful dietary pattern, with some room for improvement.   >60 Healthy dietary pattern, although there may be some specific behaviors that could be improved.    Nutrition Goals Re-Evaluation:   Nutrition Goals Discharge (Final Nutrition Goals Re-Evaluation):   Psychosocial: Target Goals: Acknowledge presence or absence of significant depression and/or stress, maximize coping skills, provide positive support system. Participant is able to verbalize types and ability to use techniques and skills needed for reducing stress and depression.   Education: Stress, Anxiety, and Depression - Group verbal and visual presentation to define topics covered.  Reviews how body is impacted by stress, anxiety, and depression.  Also discusses healthy ways to reduce stress and to treat/manage anxiety and depression.  Written material given at graduation. Flowsheet Row Cardiac Rehab from 05/03/2020 in [facility] Cardiac and Pulmonary Rehab  Date 04/26/20  Educator [facility]  Instruction Review Code 1- Verbalizes Understanding      Education: Sleep Hygiene -Provides group verbal and written instruction about how sleep can affect your health.  Define sleep hygiene, discuss sleep cycles and impact of sleep habits. Review good sleep hygiene tips.    Initial Review & Psychosocial Screening:  Initial Psych Review & Screening - 04/03/20 1553      Initial Review   Current issues with None Identified      Family Dynamics   Good Support System? Yes   cousin, friends, lots of kinfolks     Barriers   Psychosocial barriers to participate in program There are no identifiable barriers or psychosocial needs.;The patient should benefit from training in stress management and relaxation.      Screening Interventions   Interventions Encouraged to exercise;To provide support and resources with identified psychosocial needs;Provide feedback  about the scores to participant    Expected Outcomes Short Term goal: Utilizing psychosocial counselor, staff and physician to assist with identification of specific Stressors or current issues interfering with healing process. Setting desired goal for each stressor or current issue identified.;Long Term Goal: Stressors or current issues are controlled or eliminated.;Short Term goal: Identification and review with participant of any Quality of Life or Depression concerns found by scoring the questionnaire.;Long Term goal: The participant improves quality of Life and PHQ9 Scores as seen by post scores and/or  verbalization of changes           Quality of Life Scores:   Quality of Life - 04/12/20 1130      Quality of Life   Select Quality of Life      Quality of Life Scores   Health/Function Pre 16.33 %    Socioeconomic Pre 18.25 %    Psych/Spiritual Pre 18.43 %    Family Pre 16.25 %    GLOBAL Pre 17.21 %          Scores of 19 and below usually indicate a poorer quality of life in these areas.  A difference of  2-3 points is a clinically meaningful difference.  A difference of 2-3 points in the total score of the Quality of Life Index has been associated with significant improvement in overall quality of life, self-image, physical symptoms, and general health in studies assessing change in quality of life.  PHQ-9: Recent Review Flowsheet Data    Depression screen [facility] 2/9 04/12/2020   Decreased Interest 0   Down, Depressed, Hopeless 0   PHQ - 2 Score 0   Altered sleeping 0   Tired, decreased energy 1   Change in appetite 0   Feeling bad or failure about yourself  0   Trouble concentrating 0   Moving slowly or fidgety/restless 0   Suicidal thoughts 0   PHQ-9 Score 1   Difficult doing work/chores Not difficult at all     Interpretation of Total Score  Total Score Depression Severity:  1-4 = Minimal depression, 5-9 = Mild depression, 10-14 = Moderate depression, 15-19 = Moderately  severe depression, 20-27 = Severe depression   Psychosocial Evaluation and Intervention:  Psychosocial Evaluation - 04/03/20 1608      Psychosocial Evaluation & Interventions   Interventions Encouraged to exercise with the program and follow exercise prescription    Comments Terris is ready to attend the prgram without any barriers. He lives alone and has no needs. He spends his time watching TV or sitting on the porch watching the cars go by. He has friends that drop in to see him. He is trying to Quit tobacco for an upcoming surgery. He has quit before and feels he can do this. He has nicotine patches and gum being sent to him from the [place]. He should do well with the program, as he is looking forward to getting stronger and quitting tobacco.    Expected Outcomes STG; Liliane Channel attends all session scheduled LTG: Jonael is able to accomplish tobacco cessation and then maintain it after discharge    Continue Psychosocial Services  Follow up required by staff           Psychosocial Re-Evaluation:  Psychosocial Re-Evaluation    [place] Name 04/26/20 [phone]             Psychosocial Re-Evaluation   Current issues with Current Stress Concerns;Current Sleep Concerns       Comments Jerod reports no new stress concerns.  He says he sleeps some during the day.  He has trouble staying asleep sometimes.  He usually wakes up to go to the bathroom. He goes back to sleep easily.       Expected Outcomes Short: continue to attend class for exercise Long: maintain positive outlook              Psychosocial Discharge (Final Psychosocial Re-Evaluation):  Psychosocial Re-Evaluation - 04/26/20 0934      Psychosocial Re-Evaluation   Current issues with Current  Stress Concerns;Current Sleep Concerns    Comments Zoltan reports no new stress concerns.  He says he sleeps some during the day.  He has trouble staying asleep sometimes.  He usually wakes up to go to the bathroom. He goes back to sleep easily.    Expected  Outcomes Short: continue to attend class for exercise Long: maintain positive outlook           Vocational Rehabilitation: Provide vocational rehab assistance to qualifying candidates.   Vocational Rehab Evaluation & Intervention:  Vocational Rehab - 04/03/20 1555      Initial Vocational Rehab Evaluation & Intervention   Assessment shows need for Vocational Rehabilitation No           Education: Education Goals: Education classes will be provided on a variety of topics geared toward better understanding of heart health and risk factor modification. Participant will state understanding/return demonstration of topics presented as noted by education test scores.  Learning Barriers/Preferences:  Learning Barriers/Preferences - 04/03/20 1554      Learning Barriers/Preferences   Learning Barriers None    Learning Preferences None           General Cardiac Education Topics:  AED/CPR: - Group verbal and written instruction with the use of models to demonstrate the basic use of the AED with the basic ABC's of resuscitation.   Anatomy and Cardiac Procedures: - Group verbal and visual presentation and models provide information about basic cardiac anatomy and function. Reviews the testing methods done to diagnose heart disease and the outcomes of the test results. Describes the treatment choices: Medical Management, Angioplasty, or Coronary Bypass Surgery for treating various heart conditions including Myocardial Infarction, Angina, Valve Disease, and Cardiac Arrhythmias.  Written material given at graduation.   Medication Safety: - Group verbal and visual instruction to review commonly prescribed medications for heart and lung disease. Reviews the medication, class of the drug, and side effects. Includes the steps to properly store meds and maintain the prescription regimen.  Written material given at graduation.   Intimacy: - Group verbal instruction through game format to  discuss how heart and lung disease can affect sexual intimacy. Written material given at graduation..   Know Your Numbers and Heart Failure: - Group verbal and visual instruction to discuss disease risk factors for cardiac and pulmonary disease and treatment options.  Reviews associated critical values for Overweight/Obesity, Hypertension, Cholesterol, and Diabetes.  Discusses basics of heart failure: signs/symptoms and treatments.  Introduces Heart Failure Zone chart for action plan for heart failure.  Written material given at graduation.   Infection Prevention: - Provides verbal and written material to individual with discussion of infection control including proper hand washing and proper equipment cleaning during exercise session. Flowsheet Row Cardiac Rehab from 05/03/2020 in [facility] Cardiac and Pul[facility] Re[facility]  Education need identified 04/12/20  Date 04/12/20  Educator [place]  Instruction Review Code 1- Verbalizes Understanding      Falls Prevention: - Provides verbal and written material to individual with discussion of falls prevention and safety. Flowsheet Row Cardiac Rehab from 04/03/2020 in [facility] Cardiac and Pulmonary Rehab  Date 04/03/20  Educator SB  Instruction Review Code 1- Verbalizes Understanding      Other: -Provides group and verbal instruction on various topics (see comments)   Knowledge Questionnaire Score:  Knowledge Questionnaire Score - 04/12/20 1127      Knowledge Questionnaire Score   Pre Score 23/26: Angina, Heart disease, Exercise  Core Components/Risk Factors/Patient Goals at Admission:  Personal Goals and Risk Factors at Admission - 04/12/20 1408      Core Components/Risk Factors/Patient Goals on Admission    Weight Management Weight Loss    Intervention Weight Management: Develop a combined nutrition and exercise program designed to reach desired caloric intake, while maintaining appropriate intake of nutrient and fiber, sodium and fats,  and appropriate energy expenditure required for the weight goal.;Weight Management/Obesity: Establish reasonable short term and long term weight goals.    Admit Weight 236 lb (107 kg)    Goal Weight: Short Term 231 lb (104.8 kg)    Goal Weight: Long Term 190 lb (86.2 kg)    Expected Outcomes Short Term: Continue to assess and modify interventions until short term weight is achieved;Long Term: Adherence to nutrition and physical activity/exercise program aimed toward attainment of established weight goal;Weight Loss: Understanding of general recommendations for a balanced deficit meal plan, which promotes 1-2 lb weight loss per week and includes a negative energy balance of [phone] kcal/d;Understanding recommendations for meals to include 15-35% energy as protein, 25-35% energy from fat, 35-60% energy from carbohydrates, less than 218m of dietary cholesterol, 20-35 gm of total fiber daily;Understanding of distribution of calorie intake throughout the day with the consumption of 4-5 meals/snacks    Tobacco Cessation Yes   Trying the nicotine patches, does not have a quit date but thinking about quitting   Number of packs per day RMikaeelis a current tobacco user. Intervention for tobacco cessation was provided at the initial medical review. He was asked about readiness to quit and reported he is ready to quit . Patient was advised and educated about tobacco cessation using combination therapy, tobacco cessation classes, quit line, and quit smoking apps. Patient demonstrated understanding of this material. Staff will continue to provide encouragement and follow up with the patient throughout the program. REldencurrently smokes about 1/2 pack/day.    Intervention Assist the participant in steps to quit. Provide individualized education and counseling about committing to Tobacco Cessation, relapse prevention, and pharmacological support that can be provided by physician.;OAdvice worker assist with  locating and accessing local/national Quit Smoking programs, and support quit date choice.    Expected Outcomes Short Term: Will demonstrate readiness to quit, by selecting a quit date.;Short Term: Will quit all tobacco product use, adhering to prevention of relapse plan.;Long Term: Complete abstinence from all tobacco products for at least 12 months from quit date.    Diabetes Yes    Intervention Provide education about signs/symptoms and action to take for hypo/hyperglycemia.;Provide education about proper nutrition, including hydration, and aerobic/resistive exercise prescription along with prescribed medications to achieve blood glucose in normal ranges: Fasting glucose 65-99 mg/dL    Expected Outcomes Short Term: Participant verbalizes understanding of the signs/symptoms and immediate care of hyper/hypoglycemia, proper foot care and importance of medication, aerobic/resistive exercise and nutrition plan for blood glucose control.;Long Term: Attainment of HbA1C < 7%.    Heart Failure Yes    Intervention Provide a combined exercise and nutrition program that is supplemented with education, support and counseling about heart failure. Directed toward relieving symptoms such as shortness of breath, decreased exercise tolerance, and extremity edema.    Expected Outcomes Improve functional capacity of life;Short term: Attendance in program 2-3 days a week with increased exercise capacity. Reported lower sodium intake. Reported increased fruit and vegetable intake. Reports medication compliance.;Short term: Daily weights obtained and reported for increase. Utilizing diuretic protocols set by physician.;Long term:  Adoption of self-care skills and reduction of barriers for early signs and symptoms recognition and intervention leading to self-care maintenance.    Hypertension Yes    Intervention Provide education on lifestyle modifcations including regular physical activity/exercise, weight management, moderate  sodium restriction and increased consumption of fresh fruit, vegetables, and low fat dairy, alcohol moderation, and smoking cessation.;Monitor prescription use compliance.    Expected Outcomes Short Term: Continued assessment and intervention until BP is < 140/11m HG in hypertensive participants. < 130/832mHG in hypertensive participants with diabetes, heart failure or chronic kidney disease.;Long Term: Maintenance of blood pressure at goal levels.    Lipids Yes    Intervention Provide education and support for participant on nutrition & aerobic/resistive exercise along with prescribed medications to achieve LDL <7074mHDL >91m75m  Expected Outcomes Short Term: Participant states understanding of desired cholesterol values and is compliant with medications prescribed. Participant is following exercise prescription and nutrition guidelines.;Long Term: Cholesterol controlled with medications as prescribed, with individualized exercise RX and with personalized nutrition plan. Value goals: LDL < 70mg54mL > 40 mg.           Education:Diabetes - Individual verbal and written instruction to review signs/symptoms of diabetes, desired ranges of glucose level fasting, after meals and with exercise. Acknowledge that pre and post exercise glucose checks will be done for 3 sessions at entry of program. [facility]  Education need identified 04/12/20  Date 04/12/20  Educator [place]  [place] Review Code 1- Verbalizes Understanding      Core Components/Risk Factors/Patient Goals Review:   Goals and Risk Factor Review    Row Name 04/26/20 0931             Core Components/Risk Factors/Patient Goals Review   Personal Goals Review Diabetes;Hypertension;Tobacco Cessation       Review RickyChristianes he is taking medications as directed.  He monitors BG and BP at home daily.  FBG averages 174.  resting BP today 112/70.  He has reduced to 5  cigarettes.  He has thoracic suregry for esophageal cancer coming up and will have to stop smoking at that point.  He does use a patch sometimes.       Expected Outcomes Short:  continue to monitor raisk factors Long: quit smoking              Core Components/Risk Factors/Patient Goals at Discharge (Final Review):   Goals and Risk Factor Review - 04/26/20 0931      Core Components/Risk Factors/Patient Goals Review   Personal Goals Review Diabetes;Hypertension;Tobacco Cessation    Review RickyIsaiases he is taking medications as directed.  He monitors BG and BP at home daily.  FBG averages 174.  resting BP today 112/70.  He has reduced to 5 cigarettes.  He has thoracic suregry for esophageal cancer coming up and will have to stop smoking at that point.  He does use a patch sometimes.    Expected Outcomes Short:  continue to monitor raisk factors Long: quit smoking           ITP Comments:  ITP Comments    Row Name 04/03/20 1615 04/12/20 1201 04/17/20 0931 04/18/20 0959 04/25/20 0910   ITP Comments irtual orientation call completed today. he has an appointment on Date: 04/09/2020 for EP eval and gym Orientation.  Documentation of diagnosis can be found in media [facility]  .   RickyImre current  tobacco user. Intervention for tobacco cessation was provided at the initial medical review. He was asked about readiness to quit and reported he is ready to quit . Patient was advised and educated about tobacco cessation using combination therapy, tobacco cessation classes, quit line, and quit smoking apps. Patient demonstrated understanding of this material. Staff will continue to provide encouragement and follow up with the patient throughout the program. Completed 6MWT and gym orientation. Initial ITP created and sent for review to Dr. Ramonita Lab.. First full day of exercise!  Patient was oriented to gym and equipment including functions, settings, policies, and procedures.  Patient's individual  exercise prescription and treatment plan were reviewed.  All starting workloads were established based on the results of the 6 minute walk test done at initial orientation visit.  The plan for exercise progression was also introduced and progression will be customized based on patient's performance and goals. 30 Day review completed. Medical Director ITP review done, changes made as directed, and signed approval by Medical Director.   New to program Completed initial RD evaluation   Row Name 05/16/20 0715           ITP Comments 30 Day review completed. Medical Director ITP review done, changes made as directed, and signed approval by Medical Director.              Comments:

## 2020-05-17 ENCOUNTER — Other Ambulatory Visit: Payer: Self-pay

## 2020-05-17 DIAGNOSIS — I5022 Chronic systolic (congestive) heart failure: Secondary | ICD-10-CM

## 2020-05-17 NOTE — Progress Notes (Signed)
Daily Session Note  Patient Details  Name: Hunter Tran MRN: [id number] Date of Birth: Nov 10, 1955 Referring Provider:   Flowsheet Row Cardiac Rehab from 04/12/2020 in [facility] Cardiac and Pulmonary Rehab  Referring Provider [name] MD ([place])      Encounter Date: 05/17/2020  Check In:  Session Check In - 05/17/20 0917      Check-In   Supervising physician immediately available to respond to emergencies See telemetry face sheet for immediately available ER MD    Location [facility]-Cardiac & Pulmonary Rehab    Staff Present Birdie Sons, MPA, RN;Melissa Caiola RDN, Rowe Pavy, BA, ACSM CEP, Exercise Physiologist    Virtual Visit No    Medication changes reported     No    Fall or balance concerns reported    No    Tobacco Cessation No Change    Current number of cigarettes/nicotine per day     2    Warm-up and Cool-down Performed on first and last piece of equipment    Resistance Training Performed Yes    VAD Patient? No    PAD/SET Patient? No      Pain Assessment   Currently in Pain? No/denies              Social History   Tobacco Use  Smoking Status Current Some Day Smoker  . Packs/day: 1.00  . Years: 50.00  . Pack years: 50.00  . Types: Cigarettes  Smokeless Tobacco Never Used  Tobacco Comment   Quit date soon     Goals Met:  Independence with exercise equipment Exercise tolerated well No report of cardiac concerns or symptoms Strength training completed today  Goals Unmet:  Not Applicable  Comments: Pt able to follow exercise prescription today without complaint.  Will continue to monitor for progression.    Dr. Emily Filbert is Medical Director for [place] and [facility].

## 2020-05-22 ENCOUNTER — Other Ambulatory Visit: Payer: Self-pay

## 2020-05-22 ENCOUNTER — Encounter: Payer: No Typology Code available for payment source | Attending: Internal Medicine | Admitting: *Deleted

## 2020-05-22 DIAGNOSIS — I5022 Chronic systolic (congestive) heart failure: Secondary | ICD-10-CM | POA: Diagnosis present

## 2020-05-22 NOTE — Progress Notes (Signed)
Daily Session Note  Patient Details  Name: Batu Cassin MRN: [id number] Date of Birth: 02-06-1956 Referring Provider:   Flowsheet Row Cardiac Rehab from 04/12/2020 in [facility] Cardiac and Pulmonary Rehab  Referring Provider [name] MD ([place])      Encounter Date: 05/22/2020  Check In:  Session Check In - 05/22/20 0920      Check-In   Supervising physician immediately available to respond to emergencies See telemetry face sheet for immediately available ER MD    Location [facility]-Cardiac & Pulmonary Rehab    Staff Present Heath Lark, RN, BSN, Jacklynn Bue, MS Exercise Physiologist;Amanda Oletta Darter, [place], ACSM CEP, Exercise Physiologist    Virtual Visit No    Medication changes reported     No    Fall or balance concerns reported    No    Current number of cigarettes/nicotine per day     2   Trying to quit before his surgery   Warm-up and Cool-down Performed on first and last piece of equipment    Resistance Training Performed Yes    VAD Patient? No    PAD/SET Patient? No      Pain Assessment   Currently in Pain? No/denies              Social History   Tobacco Use  Smoking Status Current Some Day Smoker  . Packs/day: 1.00  . Years: 50.00  . Pack years: 50.00  . Types: Cigarettes  Smokeless Tobacco Never Used  Tobacco Comment   Quit date soon     Goals Met:  Independence with exercise equipment Exercise tolerated well No report of cardiac concerns or symptoms  Goals Unmet:  Not Applicable  Comments: Pt able to follow exercise prescription today without complaint.  Will continue to monitor for progression.    Dr. Emily Filbert is Medical Director for [place] and [facility].

## 2020-05-24 ENCOUNTER — Other Ambulatory Visit: Payer: Self-pay

## 2020-05-24 DIAGNOSIS — I5022 Chronic systolic (congestive) heart failure: Secondary | ICD-10-CM | POA: Diagnosis not present

## 2020-05-24 NOTE — Progress Notes (Signed)
Daily Session Note  Patient Details  Name: Hunter Tran MRN: [id number] Date of Birth: 11-27-1955 Referring Provider:   Flowsheet Row Cardiac Rehab from 04/12/2020 in [facility] Cardiac and Pulmonary Rehab  Referring Provider [name] MD ([place])      Encounter Date: 05/24/2020  Check In:  Session Check In - 05/24/20 0941      Check-In   Supervising physician immediately available to respond to emergencies See telemetry face sheet for immediately available ER MD    Location [facility]-Cardiac & Pulmonary Rehab    Staff Present Birdie Sons, MPA, RN;Melissa Caiola RDN, LDN;Jessica Luan Pulling, MA, RCEP, CCRP, CCET    Virtual Visit No    Medication changes reported     No    Fall or balance concerns reported    No    Tobacco Cessation No Change    Current number of cigarettes/nicotine per day     2    Warm-up and Cool-down Performed on first and last piece of equipment    Resistance Training Performed Yes    VAD Patient? No    PAD/SET Patient? No      Pain Assessment   Currently in Pain? No/denies              Social History   Tobacco Use  Smoking Status Current Some Day Smoker  . Packs/day: 1.00  . Years: 50.00  . Pack years: 50.00  . Types: Cigarettes  Smokeless Tobacco Never Used  Tobacco Comment   Quit date soon     Goals Met:  Independence with exercise equipment Exercise tolerated well No report of cardiac concerns or symptoms Strength training completed today  Goals Unmet:  Not Applicable  Comments: Pt able to follow exercise prescription today without complaint.  Will continue to monitor for progression.    Dr. Emily Filbert is Medical Director for [place] and [facility].

## 2020-05-31 ENCOUNTER — Other Ambulatory Visit: Payer: Self-pay

## 2020-05-31 DIAGNOSIS — I5022 Chronic systolic (congestive) heart failure: Secondary | ICD-10-CM | POA: Diagnosis not present

## 2020-05-31 NOTE — Progress Notes (Signed)
Daily Session Note  Patient Details  Name: Hunter Tran MRN: [id number] Date of Birth: 03-Aug-1955 Referring Provider:   Flowsheet Row Cardiac Rehab from 04/12/2020 in [facility] Cardiac and Pulmonary Rehab  Referring Provider [name] MD ([place])      Encounter Date: 05/31/2020  Check In:  Session Check In - 05/31/20 0915      Check-In   Supervising physician immediately available to respond to emergencies See telemetry face sheet for immediately available ER MD    Location [facility]-Cardiac & Pulmonary Rehab    Staff Present Birdie Sons, MPA, RN;Amanda Oletta Darter, BA, ACSM CEP, Exercise Physiologist;Hunt Zajicek Amedeo Plenty, BS, ACSM CEP, Exercise Physiologist    Virtual Visit No    Medication changes reported     No    Fall or balance concerns reported    No    Tobacco Cessation Use Decreased    Current number of cigarettes/nicotine per day     0    Warm-up and Cool-down Performed on first and last piece of equipment    Resistance Training Performed Yes    VAD Patient? No    PAD/SET Patient? No      Pain Assessment   Currently in Pain? No/denies              Social History   Tobacco Use  Smoking Status Current Some Day Smoker  . Packs/day: 1.00  . Years: 50.00  . Pack years: 50.00  . Types: Cigarettes  Smokeless Tobacco Never Used  Tobacco Comment   Quit date soon     Goals Met:  Independence with exercise equipment Exercise tolerated well No report of cardiac concerns or symptoms Strength training completed today  Goals Unmet:  Not Applicable  Comments: Pt able to follow exercise prescription today without complaint.  Will continue to monitor for progression.    Dr. Emily Filbert is Medical Director for [place] and [facility].

## 2020-06-13 ENCOUNTER — Encounter: Payer: Self-pay | Admitting: *Deleted

## 2020-06-13 DIAGNOSIS — I5022 Chronic systolic (congestive) heart failure: Secondary | ICD-10-CM

## 2020-06-13 NOTE — Progress Notes (Signed)
Cardiac Individual Treatment Plan  Patient Details  Name: Hunter Tran MRN: [id number] Date of Birth: 1956-03-19 Referring Provider:   Flowsheet Row Cardiac Rehab from 04/12/2020 in [facility] Cardiac and Pulmonary Rehab  Referring Provider [name] MD ([place])      Initial Encounter Date:  Flowsheet Row Cardiac Rehab from 04/12/2020 in [facility] Cardiac and Pulmonary Rehab  Date 04/12/20      Visit Diagnosis: Heart failure, chronic systolic ([place])  Patient's Home Medications on Admission:  Current Outpatient Medications:  .  amLODipine (NORVASC) 10 MG tablet, Take 5 mg by mouth daily. Take 1/2 of 10 mg tab, Disp: , Rfl:  .  apixaban (ELIQUIS) 5 MG TABS tablet, Take 5 mg by mouth 2 (two) times daily. On hold until surgery, Disp: , Rfl:  .  ascorbic acid (VITAMIN C) 500 MG tablet, Take 500 mg by mouth 2 (two) times daily., Disp: , Rfl:  .  atorvastatin (LIPITOR) 80 MG tablet, Take 40 mg by mouth daily., Disp: , Rfl:  .  empagliflozin (JARDIANCE) 25 MG TABS tablet, Take 25 mg by mouth daily. Take one half tablet daily, Disp: , Rfl:  .  ferrous sulfate 325 (65 FE) MG tablet, Take 325 mg by mouth daily with breakfast., Disp: , Rfl:  .  insulin glargine (LANTUS) 100 UNIT/ML injection, Inject 50 Units into the skin daily., Disp: , Rfl:  .  liraglutide (VICTOZA) 18 MG/3ML SOPN, Inject 1.2 mg into the skin daily., Disp: , Rfl:  .  metFORMIN (GLUCOPHAGE) 1000 MG tablet, Take 1,000 mg by mouth 2 (two) times daily with a meal., Disp: , Rfl:  .  metoprolol (TOPROL-XL) 200 MG 24 hr tablet, Take 200 mg by mouth daily., Disp: , Rfl:  .  nicotine (NICODERM CQ - DOSED IN MG/24 HOURS) 14 mg/24hr patch, Place 14 mg onto the skin daily., Disp: , Rfl:  .  nicotine (NICODERM CQ - DOSED IN MG/24 HR) 7 mg/24hr patch, Place 7 mg onto the skin daily. Use after 10m patch completed, Disp: , Rfl:  .  nicotine polacrilex (NICORETTE) 2 MG gum, Take 2 mg by mouth as needed for smoking cessation., Disp: , Rfl:  .  omeprazole  (PRILOSEC) 20 MG capsule, Take 20 mg by mouth 2 (two) times daily., Disp: , Rfl:  .  torsemide (DEMADEX) 20 MG tablet, Take 20 mg by mouth daily., Disp: , Rfl:  .  valsartan (DIOVAN) 320 MG tablet, Take 320 mg by mouth daily., Disp: , Rfl:   Past Medical History: No past medical history on file.  Tobacco Use: Social History   Tobacco Use  Smoking Status Current Some Day Smoker  . Packs/day: 1.00  . Years: 50.00  . Pack years: 50.00  . Types: Cigarettes  Smokeless Tobacco Never Used  Tobacco Comment   Quit date soon     Labs: Recent Review Flowsheet Data   There is no flowsheet data to display.      Exercise Target Goals: Exercise Program Goal: Individual exercise prescription set using results from initial 6 min walk test and THRR while considering  patient's activity barriers and safety.   Exercise Prescription Goal: Initial exercise prescription builds to 30-45 minutes a day of aerobic activity, 2-3 days per week.  Home exercise guidelines will be given to patient during program as part of exercise prescription that the participant will acknowledge.   Education: Aerobic Exercise: - Group verbal and visual presentation on the components of exercise prescription. Introduces F.I.T.T principle from ACSM for exercise  prescriptions.  Reviews F.I.T.T. principles of aerobic exercise including progression. Written material given at graduation.   Education: Resistance Exercise: - Group verbal and visual presentation on the components of exercise prescription. Introduces F.I.T.T principle from ACSM for exercise prescriptions  Reviews F.I.T.T. principles of resistance exercise including progression. Written material given at graduation.    Education: Exercise & Equipment Safety: - Individual verbal instruction and demonstration of equipment use and safety with use of the equipment. Flowsheet Row Cardiac Rehab from 05/03/2020 in [facility] Cardiac and Pulmonary Rehab  Education need  identified 04/12/20  Date 04/12/20  Educator [place]  Instruction Review Code 1- Verbalizes Understanding      Education: Exercise Physiology & General Exercise Guidelines: - Group verbal and written instruction with models to review the exercise physiology of the cardiovascular system and associated critical values. Provides general exercise guidelines with specific guidelines to those with heart or lung disease.  Flowsheet Row Cardiac Rehab from 05/03/2020 in [facility] Cardiac and Pulmonary Rehab  Date 05/03/20  Educator [facility]  Instruction Review Code 1- Verbalizes Understanding      Education: Flexibility, Balance, Mind/Body Relaxation: - Group verbal and visual presentation with interactive activity on the components of exercise prescription. Introduces F.I.T.T principle from ACSM for exercise prescriptions. Reviews F.I.T.T. principles of flexibility and balance exercise training including progression. Also discusses the mind body connection.  Reviews various relaxation techniques to help reduce and manage stress (i.e. Deep breathing, progressive muscle relaxation, and visualization). Balance handout provided to take home. Written material given at graduation.   Activity Barriers & Risk Stratification:  Activity Barriers & Cardiac Risk Stratification - 04/12/20 1350      Activity Barriers & Cardiac Risk Stratification   Activity Barriers Arthritis    Cardiac Risk Stratification High           6 Minute Walk:  6 Minute Walk    Row Name 04/12/20 1349         6 Minute Walk   Phase Initial     Distance 1180 feet     Walk Time 6 minutes     # of Rest Breaks 0     MPH 2.23     METS 2.7     RPE 7     Perceived Dyspnea  0     VO2 Peak 9.45     Symptoms No     Resting HR 80 bpm     Resting BP 116/64     Resting Oxygen Saturation  97 %     Exercise Oxygen Saturation  during 6 min walk 93 %     Max Ex. HR 91 bpm     Max Ex. BP 140/66     2 Minute Post BP 122/66            Oxygen  Initial Assessment:   Oxygen Re-Evaluation:   Oxygen Discharge (Final Oxygen Re-Evaluation):   Initial Exercise Prescription:  Initial Exercise Prescription - 04/12/20 1300      Date of Initial Exercise RX and Referring Provider   Date 04/12/20    Referring Provider [name] MD ([facility])      Treadmill   MPH 2.3    Grade 0.5    Minutes 15    METs 2.92      Recumbant Bike   Level 3    RPM 60    Watts 20    Minutes 15    METs 2.7      NuStep   Level 2  SPM 80    Minutes 15    METs 2.7      T5 Nustep   Level 2    SPM 80    Minutes 15    METs 2.7      Prescription Details   Frequency (times per week) 2    Duration Progress to 30 minutes of continuous aerobic without signs/symptoms of physical distress      Intensity   THRR 40-80% of Max Heartrate 110-141    Ratings of Perceived Exertion 11-13    Perceived Dyspnea 0-4      Progression   Progression Continue to progress workloads to maintain intensity without signs/symptoms of physical distress.      Resistance Training   Training Prescription Yes    Weight 3 lb    Reps 10-15           Perform Capillary Blood Glucose checks as needed.  Exercise Prescription Changes:  Exercise Prescription Changes    Row Name 04/12/20 1400 04/25/20 1300 05/08/20 1500 05/21/20 1100 06/06/20 1000     Response to Exercise   Blood Pressure (Admit) 116/64 128/72 134/66 144/78 120/78   Blood Pressure (Exercise) 140/66 150/80 144/66 138/76 130/62   Blood Pressure (Exit) 122/66 130/72 132/68 142/80 104/60   Heart Rate (Admit) 80 bpm 75 bpm 88 bpm 83 bpm 67 bpm   Heart Rate (Exercise) 91 bpm 97 bpm 91 bpm 94 bpm 95 bpm   Heart Rate (Exit) 81 bpm 78 bpm 85 bpm 85 bpm 80 bpm   Oxygen Saturation (Admit) 97 % -- -- -- --   Oxygen Saturation (Exercise) 93 % -- -- -- --   Oxygen Saturation (Exit) 97 % -- -- -- --   Rating of Perceived Exertion (Exercise) 7 13 15 14 15    Perceived Dyspnea (Exercise) 0 -- -- -- --   Symptoms  none none none none none   Comments walk test results second day -- -- --   Duration -- Progress to 30 minutes of  aerobic without signs/symptoms of physical distress Continue with 30 min of aerobic exercise without signs/symptoms of physical distress. Continue with 30 min of aerobic exercise without signs/symptoms of physical distress. Continue with 30 min of aerobic exercise without signs/symptoms of physical distress.   Intensity -- THRR unchanged THRR unchanged THRR unchanged THRR unchanged     Progression   Progression -- Continue to progress workloads to maintain intensity without signs/symptoms of physical distress. Continue to progress workloads to maintain intensity without signs/symptoms of physical distress. Continue to progress workloads to maintain intensity without signs/symptoms of physical distress. Continue to progress workloads to maintain intensity without signs/symptoms of physical distress.   Average METs -- 2.7 2.8 3.3 3.02     Resistance Training   Training Prescription Yes Yes Yes Yes Yes   Weight 3 lb 3 lb 4 lb 4 lb 5 lb   Reps 10-15 10-15 10-15 10-15 10-15     Interval Training   Interval Training -- -- No No No     Treadmill   MPH 2.3 -- 1.7 -- 2.2   Grade 0.5 -- 0 -- 0   Minutes 15 -- 15 -- 15   METs 2.92 -- 2.3 -- 2.68     Recumbant Bike   Level 3 3 4 6 6    RPM 60 60 -- -- --   Watts 20 29 32 -- 47   Minutes 15 15 15 15 15    METs 2.7 2.86 2.9 2.6  3.28     NuStep   Level $Remo'2 3 4 5 'qzjRX$ --   SPM 80 80 -- 80 --   Minutes $Remove'15 15 15 15 'YPmafPb$ --   METs 2.7 2.3 3.3 4 --     Arm Ergometer   Level -- -- -- -- 1   Minutes -- -- -- -- 15     Elliptical   Level -- -- -- -- 1   Speed -- -- -- -- 2.5   Minutes -- -- -- -- 4     T5 Nustep   Level 2 -- 3 -- --   SPM 80 -- -- -- --   Minutes 15 -- 15 -- --   METs 2.7 -- 2.7 -- --     Home Exercise Plan   Plans to continue exercise at -- -- [facility]  and walking at home [facility]  and walking at  home [facility]  and walking at home   Frequency -- -- Add 2 additional days to program exercise sessions. Add 2 additional days to program exercise sessions. Add 2 additional days to program exercise sessions.   Initial Home Exercises Provided -- -- 04/26/20 04/26/20 04/26/20          Exercise Comments:  Exercise Comments    Row Name 04/17/20 0931           Exercise Comments First full day of exercise!  Patient was oriented to gym and equipment including functions, settings, policies, and procedures.  Patient's individual exercise prescription and treatment plan were reviewed.  All starting workloads were established based on the results of the 6 minute walk test done at initial orientation visit.  The plan for exercise progression was also introduced and progression will be customized based on patient's performance and goals.              Exercise Goals and Review:  Exercise Goals    Row Name 04/12/20 1406             Exercise Goals   Increase Physical Activity Yes       Intervention Provide advice, education, support and counseling about physical activity/exercise needs.;Develop an individualized exercise prescription for aerobic and resistive training based on initial evaluation findings, risk stratification, comorbidities and participant's personal goals.       Expected Outcomes Short Term: Attend rehab on a regular basis to increase amount of physical activity.;Long Term: Add in home exercise to make exercise part of routine and to increase amount of physical activity.;Long Term: Exercising regularly at least 3-5 days a week.       Increase Strength and Stamina Yes       Intervention Provide advice, education, support and counseling about physical activity/exercise needs.;Develop an individualized exercise prescription for aerobic and resistive training based on initial evaluation findings, risk stratification, comorbidities and participant's personal goals.        Expected Outcomes Short Term: Increase workloads from initial exercise prescription for resistance, speed, and METs.;Short Term: Perform resistance training exercises routinely during rehab and add in resistance training at home;Long Term: Improve cardiorespiratory fitness, muscular endurance and strength as measured by increased METs and functional capacity (6MWT)       Able to understand and use rate of perceived exertion (RPE) scale Yes       Intervention Provide education and explanation on how to use RPE scale       Expected Outcomes Short Term: Able to use RPE daily in rehab  to express subjective intensity level;Long Term:  Able to use RPE to guide intensity level when exercising independently       Able to understand and use Dyspnea scale Yes       Intervention Provide education and explanation on how to use Dyspnea scale       Expected Outcomes Short Term: Able to use Dyspnea scale daily in rehab to express subjective sense of shortness of breath during exertion;Long Term: Able to use Dyspnea scale to guide intensity level when exercising independently       Knowledge and understanding of Target Heart Rate Range (THRR) Yes       Intervention Provide education and explanation of THRR including how the numbers were predicted and where they are located for reference       Expected Outcomes Short Term: Able to state/look up THRR;Short Term: Able to use daily as guideline for intensity in rehab;Long Term: Able to use THRR to govern intensity when exercising independently       Able to check pulse independently Yes       Intervention Provide education and demonstration on how to check pulse in carotid and radial arteries.;Review the importance of being able to check your own pulse for safety during independent exercise       Expected Outcomes Short Term: Able to explain why pulse checking is important during independent exercise;Long Term: Able to check pulse independently and accurately        Understanding of Exercise Prescription Yes       Intervention Provide education, explanation, and written materials on patient's individual exercise prescription       Expected Outcomes Short Term: Able to explain program exercise prescription;Long Term: Able to explain home exercise prescription to exercise independently              Exercise Goals Re-Evaluation :  Exercise Goals Re-Evaluation    Row Name 04/17/20 0932 04/25/20 1345 04/26/20 0945 05/08/20 1549 05/21/20 1145     Exercise Goal Re-Evaluation   Exercise Goals Review Able to understand and use rate of perceived exertion (RPE) scale;Able to understand and use Dyspnea scale;Knowledge and understanding of Target Heart Rate Range (THRR);Understanding of Exercise Prescription Increase Physical Activity;Increase Strength and Stamina Increase Physical Activity;Increase Strength and Stamina;Able to understand and use rate of perceived exertion (RPE) scale;Knowledge and understanding of Target Heart Rate Range (THRR);Able to check pulse independently Increase Physical Activity;Increase Strength and Stamina;Understanding of Exercise Prescription Increase Physical Activity;Increase Strength and Stamina   Comments Reviewed RPE and dyspnea scales, THR and program prescription with pt today.  Pt voiced understanding and was given a copy of goals to take home. Hunter Tran is off to a good start in rehab.  We will continue to monitor progress. Reviewed home exercise with pt today.  Pt plans to walk for exercise.  Reviewed THR, pulse, RPE, sign and symptoms, pulse oximetery and when to call 911 or MD.  Also discussed weather considerations and indoor options.  Pt voiced understanding. Hunter Tran is doing well in rehab.  He is up to 32 watts on the bike.  We will continue to monitor his progress. Hunter Tran continues to do well.  He has increased level on RB.   Expected Outcomes Short: Use RPE daily to regulate intensity. Long: Follow program prescription in THR. Short:  attend rehab consistently Long:  improve overall stamina Short: practice mointoring HR whil exercising Long:  increase MET level Short: Increase workload on treadmill Long: Continue to improve stamina. --  [place] Name 05/21/20 1147 05/22/20 0932 06/06/20 1046         Exercise Goal Re-Evaluation   Exercise Goals Review -- Increase Physical Activity;Increase Strength and Stamina Increase Physical Activity;Increase Strength and Stamina;Understanding of Exercise Prescription     Comments He has aso increased levels on TM. He reports walking at home (2.5 miles RPE 12) and doing balance exercise and handweights (5lbs) - 2x/week. He warms up and cools down at home when he is walking. Hunter Tran is doing well in rehab.  He was able to get to 4 min on the ellipitcal and is up to 47 watts on the bike..  We will continue to monitor his progress.     Expected Outcomes Short: continue to progress workloads Long:  increase overall MET level ST: continue to exercise consistently at home LT: increase MET levels Short: Return to regularly attendance Long: Continue to improve stamina.            Discharge Exercise Prescription (Final Exercise Prescription Changes):  Exercise Prescription Changes - 06/06/20 1000      Response to Exercise   Blood Pressure (Admit) 120/78    Blood Pressure (Exercise) 130/62    Blood Pressure (Exit) 104/60    Heart Rate (Admit) 67 bpm    Heart Rate (Exercise) 95 bpm    Heart Rate (Exit) 80 bpm    Rating of Perceived Exertion (Exercise) 15    Symptoms none    Duration Continue with 30 min of aerobic exercise without signs/symptoms of physical distress.    Intensity THRR unchanged      Progression   Progression Continue to progress workloads to maintain intensity without signs/symptoms of physical distress.    Average METs 3.02      Resistance Training   Training Prescription Yes    Weight 5 lb    Reps 10-15      Interval Training   Interval Training No      Treadmill    MPH 2.2    Grade 0    Minutes 15    METs 2.68      Recumbant Bike   Level 6    Watts 47    Minutes 15    METs 3.28      Arm Ergometer   Level 1    Minutes 15      Elliptical   Level 1    Speed 2.5    Minutes 4      Home Exercise Plan   Plans to continue exercise at [facility]   and walking at home   Frequency Add 2 additional days to program exercise sessions.    Initial Home Exercises Provided 04/26/20           Nutrition:  Target Goals: Understanding of nutrition guidelines, daily intake of sodium '1500mg'$ , cholesterol '200mg'$ , calories 30% from fat and 7% or less from saturated fats, daily to have 5 or more servings of fruits and vegetables.  Education: All About Nutrition: -Group instruction provided by verbal, written material, interactive activities, discussions, models, and posters to present general guidelines for heart healthy nutrition including fat, fiber, MyPlate, the role of sodium in heart healthy nutrition, utilization of the nutrition label, and utilization of this knowledge for meal planning. Follow up email sent as well. Written material given at graduation.   Biometrics:  Pre Biometrics - 04/12/20 1407      Pre Biometrics   Height 5' 10.25" (1.784 m)    Weight 236 lb 11.2  oz (107.4 kg)    BMI (Calculated) 33.73    Single Leg Stand 6.28 seconds            Nutrition Therapy Plan and Nutrition Goals:  Nutrition Therapy & Goals - 04/25/20 0846      Nutrition Therapy   Diet Heart healthy, low Na    Drug/Food Interactions Statins/Certain Fruits    Protein (specify units) 85g    Fiber 30 grams    Whole Grain Foods 3 servings    Saturated Fats 12 max. grams    Fruits and Vegetables 8 servings/day    Sodium 1.5 grams      Personal Nutrition Goals   Nutrition Goal ST: for lunch have tuna or chicken salad instead of fast food LT: limit saturated fat to <12g/day    Comments Coffee (equal and creamer) B: eggs with bacon, sausage, or ham L:  take out (fast food) - burger D: frozen dinner  or cook (2x/week - he also eats leftovers) chicken and pork, spaghetti, soup, sometimes shrimp or fish. He has potatoes, peas, carrots, fried onions, can of mushrooms, beans (all), greens. He does not cook with salt, he adds a bit a sea salt at the end, but not always. He uses vegetable oil, he uses butter with grits. Drinks: water S: pork rinds and potato chips. He doesn't eat bread [facility] not [facility] and now he eats saltines. He tries to keep fruit like grapes and oranges. His teeth prevent him from having anything too hard. Discussed heart healthy eating. Discussed whole grains and suggested whole grain crackers instead of saltines if he doesn't care for bread. Suggested replacing lunch fast food with something prepared at home. Suggested limiting processed meat and pork rinds. Suggested more variety in vegetables (more non-starchy).      Intervention Plan   Intervention Prescribe, educate and counsel regarding individualized specific dietary modifications aiming towards targeted core components such as weight, hypertension, lipid management, diabetes, heart failure and other comorbidities.;Nutrition handout(s) given to patient.    Expected Outcomes Short Term Goal: Understand basic principles of dietary content, such as calories, fat, sodium, cholesterol and nutrients.;Short Term Goal: A plan has been developed with personal nutrition goals set during dietitian appointment.;Long Term Goal: Adherence to prescribed nutrition plan.           Nutrition Assessments:  MEDIFICTS Score Key:  ?70 Need to make dietary changes   40-70 Heart Healthy Diet  ? 40 Therapeutic Level Cholesterol Diet  Flowsheet Row Cardiac Rehab from 04/17/2020 in [facility] Cardiac and Pulmonary Rehab  Picture Your Plate Total Score on Admission 45     Picture Your Plate Scores:  <50 Unhealthy dietary pattern with much room for improvement.  41-50 Dietary pattern  unlikely to meet recommendations for good health and room for improvement.  51-60 More healthful dietary pattern, with some room for improvement.   >60 Healthy dietary pattern, although there may be some specific behaviors that could be improved.    Nutrition Goals Re-Evaluation:  Nutrition Goals Re-Evaluation    [place] Name 05/22/20 [phone]             Goals   Nutrition Goal ST: continue with current changes LT: limit saturated fat to <12g/day       Comment He reports having chicken salad and tuna salad for lunch, has oatmeal in the morning, and small piece of meat with vegetables for dinner. He reports trying to go to the dentist to get false teeth  to eat more. He would not like to make any further changes at this time and would like to continue with the changes he has made so far.       Expected Outcome ST: continue with current changes LT: limit saturated fat to <12g/day              Nutrition Goals Discharge (Final Nutrition Goals Re-Evaluation):  Nutrition Goals Re-Evaluation - 05/22/20 0939      Goals   Nutrition Goal ST: continue with current changes LT: limit saturated fat to <12g/day    Comment He reports having chicken salad and tuna salad for lunch, has oatmeal in the morning, and small piece of meat with vegetables for dinner. He reports trying to go to the dentist to get false teeth to eat more. He would not like to make any further changes at this time and would like to continue with the changes he has made so far.    Expected Outcome ST: continue with current changes LT: limit saturated fat to <12g/day           Psychosocial: Target Goals: Acknowledge presence or absence of significant depression and/or stress, maximize coping skills, provide positive support system. Participant is able to verbalize types and ability to use techniques and skills needed for reducing stress and depression.   Education: Stress, Anxiety, and Depression - Group verbal and visual  presentation to define topics covered.  Reviews how body is impacted by stress, anxiety, and depression.  Also discusses healthy ways to reduce stress and to treat/manage anxiety and depression.  Written material given at graduation. Flowsheet Row Cardiac Rehab from 05/03/2020 in [facility] Cardiac and Pulmonary Rehab  Date 04/26/20  Educator [facility]  Instruction Review Code 1- Verbalizes Understanding      Education: Sleep Hygiene -Provides group verbal and written instruction about how sleep can affect your health.  Define sleep hygiene, discuss sleep cycles and impact of sleep habits. Review good sleep hygiene tips.    Initial Review & Psychosocial Screening:  Initial Psych Review & Screening - 04/03/20 1553      Initial Review   Current issues with None Identified      Family Dynamics   Good Support System? Yes   cousin, friends, lots of kinfolks     Barriers   Psychosocial barriers to participate in program There are no identifiable barriers or psychosocial needs.;The patient should benefit from training in stress management and relaxation.      Screening Interventions   Interventions Encouraged to exercise;To provide support and resources with identified psychosocial needs;Provide feedback about the scores to participant    Expected Outcomes Short Term goal: Utilizing psychosocial counselor, staff and physician to assist with identification of specific Stressors or current issues interfering with healing process. Setting desired goal for each stressor or current issue identified.;Long Term Goal: Stressors or current issues are controlled or eliminated.;Short Term goal: Identification and review with participant of any Quality of Life or Depression concerns found by scoring the questionnaire.;Long Term goal: The participant improves quality of Life and PHQ9 Scores as seen by post scores and/or verbalization of changes           Quality of Life Scores:   Quality of Life - 04/12/20 1130       Quality of Life   Select Quality of Life      Quality of Life Scores   Health/Function Pre 16.33 %    Socioeconomic Pre 18.25 %    Psych/Spiritual Pre  18.43 %    Family Pre 16.25 %    GLOBAL Pre 17.21 %          Scores of 19 and below usually indicate a poorer quality of life in these areas.  A difference of  2-3 points is a clinically meaningful difference.  A difference of 2-3 points in the total score of the Quality of Life Index has been associated with significant improvement in overall quality of life, self-image, physical symptoms, and general health in studies assessing change in quality of life.  PHQ-9: Recent Review Flowsheet Data    Depression screen [facility] 2/9 04/12/2020   Decreased Interest 0   Down, Depressed, Hopeless 0   PHQ - 2 Score 0   Altered sleeping 0   Tired, decreased energy 1   Change in appetite 0   Feeling bad or failure about yourself  0   Trouble concentrating 0   Moving slowly or fidgety/restless 0   Suicidal thoughts 0   PHQ-9 Score 1   Difficult doing work/chores Not difficult at all     Interpretation of Total Score  Total Score Depression Severity:  1-4 = Minimal depression, 5-9 = Mild depression, 10-14 = Moderate depression, 15-19 = Moderately severe depression, 20-27 = Severe depression   Psychosocial Evaluation and Intervention:  Psychosocial Evaluation - 04/03/20 1608      Psychosocial Evaluation & Interventions   Interventions Encouraged to exercise with the program and follow exercise prescription    Comments Kristina is ready to attend the prgram without any barriers. He lives alone and has no needs. He spends his time watching TV or sitting on the porch watching the cars go by. He has friends that drop in to see him. He is trying to Quit tobacco for an upcoming surgery. He has quit before and feels he can do this. He has nicotine patches and gum being sent to him from the [place]. He should do well with the program, as he is looking forward to  getting stronger and quitting tobacco.    Expected Outcomes STG; Hunter Tran attends all session scheduled LTG: [place] is able to accomplish tobacco cessation and then maintain it after discharge    Continue Psychosocial Services  Follow up required by staff           Psychosocial Re-Evaluation:  Psychosocial Re-Evaluation    [place] Name 04/26/20 0934 05/22/20 0935           Psychosocial Re-Evaluation   Current issues with Current Stress Concerns;Current Sleep Concerns --      Comments Hunter Tran reports no new stress concerns.  He says he sleeps some during the day.  He has trouble staying asleep sometimes.  He usually wakes up to go to the bathroom. He goes back to sleep easily. Hunter Tran reports no new stress concerns.  He reports he is a good napper and will sleep sometimes during the day.  He has trouble staying asleep sometimes due to going to the bathroom - he goes back to sleep easily. He reports he is bored sometimes at home due to the cold and is excited for the warm weather activities.      Expected Outcomes Short: continue to attend class for exercise Long: maintain positive outlook Short: continue to attend class for exercise Long: maintain positive outlook      Interventions -- Encouraged to attend Cardiac Rehabilitation for the exercise      Continue Psychosocial Services  -- Follow up required by staff  Psychosocial Discharge (Final Psychosocial Re-Evaluation):  Psychosocial Re-Evaluation - 05/22/20 0935      Psychosocial Re-Evaluation   Comments Hunter Tran reports no new stress concerns.  He reports he is a good napper and will sleep sometimes during the day.  He has trouble staying asleep sometimes due to going to the bathroom - he goes back to sleep easily. He reports he is bored sometimes at home due to the cold and is excited for the warm weather activities.    Expected Outcomes Short: continue to attend class for exercise Long: maintain positive outlook    Interventions  Encouraged to attend Cardiac Rehabilitation for the exercise    Continue Psychosocial Services  Follow up required by staff           Vocational Rehabilitation: Provide vocational rehab assistance to qualifying candidates.   Vocational Rehab Evaluation & Intervention:  Vocational Rehab - 04/03/20 1555      Initial Vocational Rehab Evaluation & Intervention   Assessment shows need for Vocational Rehabilitation No           Education: Education Goals: Education classes will be provided on a variety of topics geared toward better understanding of heart health and risk factor modification. Participant will state understanding/return demonstration of topics presented as noted by education test scores.  Learning Barriers/Preferences:  Learning Barriers/Preferences - 04/03/20 1554      Learning Barriers/Preferences   Learning Barriers None    Learning Preferences None           General Cardiac Education Topics:  AED/CPR: - Group verbal and written instruction with the use of models to demonstrate the basic use of the AED with the basic ABC's of resuscitation.   Anatomy and Cardiac Procedures: - Group verbal and visual presentation and models provide information about basic cardiac anatomy and function. Reviews the testing methods done to diagnose heart disease and the outcomes of the test results. Describes the treatment choices: Medical Management, Angioplasty, or Coronary Bypass Surgery for treating various heart conditions including Myocardial Infarction, Angina, Valve Disease, and Cardiac Arrhythmias.  Written material given at graduation.   Medication Safety: - Group verbal and visual instruction to review commonly prescribed medications for heart and lung disease. Reviews the medication, class of the drug, and side effects. Includes the steps to properly store meds and maintain the prescription regimen.  Written material given at graduation.   Intimacy: - Group verbal  instruction through game format to discuss how heart and lung disease can affect sexual intimacy. Written material given at graduation..   Know Your Numbers and Heart Failure: - Group verbal and visual instruction to discuss disease risk factors for cardiac and pulmonary disease and treatment options.  Reviews associated critical values for Overweight/Obesity, Hypertension, Cholesterol, and Diabetes.  Discusses basics of heart failure: signs/symptoms and treatments.  Introduces Heart Failure Zone chart for action plan for heart failure.  Written material given at graduation.   Infection Prevention: - Provides verbal and written material to individual with discussion of infection control including proper hand washing and proper equipment cleaning during exercise session. Flowsheet Row Cardiac Rehab from [facility]/03/2020 in [facility] Cardiac and Pul[facility] Rehab  Education need identified 04/12/20  Date 04/12/20  Educator [place]  Instruction Review Code 1- Verbalizes Understanding      Falls Prevention: - Provides verbal and written material to individual with discussion of falls prevention and safety. Flowsheet Row Cardiac Rehab from 04/03/2020 in [facility] Cardiac and Pulmonary Rehab  Date 04/03/20  Educator [place]  Instruction  Review Code 1- Verbalizes Understanding      Other: -Provides group and verbal instruction on various topics (see comments)   Knowledge Questionnaire Score:  Knowledge Questionnaire Score - 04/12/20 1127      Knowledge Questionnaire Score   Pre Score 23/26: Angina, Heart disease, Exercise           Core Components/Risk Factors/Patient Goals at Admission:  Personal Goals and Risk Factors at Admission - 04/12/20 1408      Core Components/Risk Factors/Patient Goals on Admission    Weight Management Weight Loss    Intervention Weight Management: Develop a combined nutrition and exercise program designed to reach desired caloric intake, while maintaining appropriate intake of  nutrient and fiber, sodium and fats, and appropriate energy expenditure required for the weight goal.;Weight Management/Obesity: Establish reasonable short term and long term weight goals.    Admit Weight 236 lb (107 kg)    Goal Weight: Short Term 231 lb (104.8 kg)    Goal Weight: Long Term 190 lb (86.2 kg)    Expected Outcomes Short Term: Continue to assess and modify interventions until short term weight is achieved;Long Term: Adherence to nutrition and physical activity/exercise program aimed toward attainment of established weight goal;Weight Loss: Understanding of general recommendations for a balanced deficit meal plan, which promotes 1-2 lb weight loss per week and includes a negative energy balance of [phone] kcal/d;Understanding recommendations for meals to include 15-35% energy as protein, 25-35% energy from fat, 35-60% energy from carbohydrates, less than $RemoveB'200mg'eReTTWJX$  of dietary cholesterol, 20-35 gm of total fiber daily;Understanding of distribution of calorie intake throughout the day with the consumption of 4-5 meals/snacks    Tobacco Cessation Yes   Trying the nicotine patches, does not have a quit date but thinking about quitting   Number of packs per day Hunter Tran is a current tobacco user. Intervention for tobacco cessation was provided at the initial medical review. He was asked about readiness to quit and reported he is ready to quit . Patient was advised and educated about tobacco cessation using combination therapy, tobacco cessation classes, quit line, and quit smoking apps. Patient demonstrated understanding of this material. Staff will continue to provide encouragement and follow up with the patient throughout the program. Hunter Tran currently smokes about 1/2 pack/day.    Intervention Assist the participant in steps to quit. Provide individualized education and counseling about committing to Tobacco Cessation, relapse prevention, and pharmacological support that can be provided by physician.;Ship broker, assist with locating and accessing local/national Quit Smoking programs, and support quit date choice.    Expected Outcomes Short Term: Will demonstrate readiness to quit, by selecting a quit date.;Short Term: Will quit all tobacco product use, adhering to prevention of relapse plan.;Long Term: Complete abstinence from all tobacco products for at least 12 months from quit date.    Diabetes Yes    Intervention Provide education about signs/symptoms and action to take for hypo/hyperglycemia.;Provide education about proper nutrition, including hydration, and aerobic/resistive exercise prescription along with prescribed medications to achieve blood glucose in normal ranges: Fasting glucose 65-99 mg/dL    Expected Outcomes Short Term: Participant verbalizes understanding of the signs/symptoms and immediate care of hyper/hypoglycemia, proper foot care and importance of medication, aerobic/resistive exercise and nutrition plan for blood glucose control.;Long Term: Attainment of HbA1C < 7%.    Heart Failure Yes    Intervention Provide a combined exercise and nutrition program that is supplemented with education, support and counseling about heart failure. Directed toward relieving symptoms such  as shortness of breath, decreased exercise tolerance, and extremity edema.    Expected Outcomes Improve functional capacity of life;Short term: Attendance in program 2-3 days a week with increased exercise capacity. Reported lower sodium intake. Reported increased fruit and vegetable intake. Reports medication compliance.;Short term: Daily weights obtained and reported for increase. Utilizing diuretic protocols set by physician.;Long term: Adoption of self-care skills and reduction of barriers for early signs and symptoms recognition and intervention leading to self-care maintenance.    Hypertension Yes    Intervention Provide education on lifestyle modifcations including regular physical  activity/exercise, weight management, moderate sodium restriction and increased consumption of fresh fruit, vegetables, and low fat dairy, alcohol moderation, and smoking cessation.;Monitor prescription use compliance.    Expected Outcomes Short Term: Continued assessment and intervention until BP is < 140/45mm HG in hypertensive participants. < 130/79mm HG in hypertensive participants with diabetes, heart failure or chronic kidney disease.;Long Term: Maintenance of blood pressure at goal levels.    Lipids Yes    Intervention Provide education and support for participant on nutrition & aerobic/resistive exercise along with prescribed medications to achieve LDL '70mg'$ , HDL >$Remo'40mg'gSDmQ$ .    Expected Outcomes Short Term: Participant states understanding of desired cholesterol values and is compliant with medications prescribed. Participant is following exercise prescription and nutrition guidelines.;Long Term: Cholesterol controlled with medications as prescribed, with individualized exercise RX and with personalized nutrition plan. Value goals: LDL < $Rem'70mg'HjJc$ , HDL > 40 mg.           Education:Diabetes - Individual verbal and written instruction to review signs/symptoms of diabetes, desired ranges of glucose level fasting, after meals and with exercise. Acknowledge that pre and post exercise glucose checks will be done for 3 sessions at entry of program. [place] from 05/03/2020 in [facility] Cardiac and Pulmonary Rehab  Education need identified 04/12/20  Date 04/12/20  Educator [place]  Instruction Review Code 1- Verbalizes Understanding      Core Components/Risk Factors/Patient Goals Review:   Goals and Risk Factor Review    Row Name 04/26/20 0931 05/22/20 0937           Core Components/Risk Factors/Patient Goals Review   Personal Goals Review Diabetes;Hypertension;Tobacco Cessation Diabetes;Hypertension;Tobacco Cessation      Review Hunter Tran states he is taking medications as directed.  He  monitors BG and BP at home daily.  FBG averages 174.  resting BP today 112/70.  He has reduced to 5 cigarettes.  He has thoracic suregry for esophageal cancer coming up and will have to stop smoking at that point.  He does use a patch sometimes. Hunter Tran is taking his medications as directed. BG is about 120 in the am; can be 110 to 200 (once every two weeks around 200 if he ate something sweet the night before. He still checks his BP at home 130/80. He reports he is now down to 2 cigarettes and his quit date is his surgery date 3/29.      Expected Outcomes Short:  continue to monitor raisk factors Long: quit smoking Short:  continue to monitor raisk factors, continue to check BP and BG at home  Long: quit smoking             Core Components/Risk Factors/Patient Goals at Discharge (Final Review):   Goals and Risk Factor Review - 05/22/20 0937      Core Components/Risk Factors/Patient Goals Review   Personal Goals Review Diabetes;Hypertension;Tobacco Cessation    Review Hunter Tran is taking his medications as directed. BG is  about 120 in the am; can be 110 to 200 (once every two weeks around 200 if he ate something sweet the night before. He still checks his BP at home 130/80. He reports he is now down to 2 cigarettes and his quit date is his surgery date 3/29.    Expected Outcomes Short:  continue to monitor raisk factors, continue to check BP and BG at home  Long: quit smoking           ITP Comments:  ITP Comments    Row Name 04/03/20 1615 04/12/20 1201 04/17/20 0931 04/18/20 0959 04/25/20 0910   ITP Comments irtual orientation call completed today. he has an appointment on Date: 04/09/2020 for EP eval and gym Orientation.  Documentation of diagnosis can be found in media scan [facility] records  .   Hunter Tran is a current tobacco user. Intervention for tobacco cessation was provided at the initial medical review. He was asked about readiness to quit and reported he is ready to quit . Patient was advised and  educated about tobacco cessation using combination therapy, tobacco cessation classes, quit line, and quit smoking apps. Patient demonstrated understanding of this material. Staff will continue to provide encouragement and follow up with the patient throughout the program. Completed 6MWT and gym orientation. Initial ITP created and sent for review to Dr. Ramonita Lab.. First full day of exercise!  Patient was oriented to gym and equipment including functions, settings, policies, and procedures.  Patient's individual exercise prescription and treatment plan were reviewed.  All starting workloads were established based on the results of the 6 minute walk test done at initial orientation visit.  The plan for exercise progression was also introduced and progression will be customized based on patient's performance and goals. 30 Day review completed. Medical Director ITP review done, changes made as directed, and signed approval by Medical Director.   New to program Completed initial RD evaluation   Row Name 05/16/20 0715 06/13/20 1129         ITP Comments 30 Day review completed. Medical Director ITP review done, changes made as directed, and signed approval by Medical Director. 30 Day review completed. Medical Director ITP review done, changes made as directed, and signed approval by Medical Director.             Comments:

## 2020-06-14 ENCOUNTER — Telehealth: Payer: Self-pay

## 2020-06-14 DIAGNOSIS — I5022 Chronic systolic (congestive) heart failure: Secondary | ICD-10-CM

## 2020-06-14 NOTE — Telephone Encounter (Signed)
Called patient as he has not been to cardiac rehab in a couple of weeks. Patient states that he has a surgery scheduled for 3/31 and will not be coming for the time being. He will need to be put on a medical hold until he has clearance to come back to exercise. Patient is aware.

## 2020-06-19 DIAGNOSIS — I5022 Chronic systolic (congestive) heart failure: Secondary | ICD-10-CM

## 2020-07-02 ENCOUNTER — Encounter: Payer: Self-pay | Admitting: *Deleted

## 2020-07-02 DIAGNOSIS — I5022 Chronic systolic (congestive) heart failure: Secondary | ICD-10-CM

## 2020-07-11 ENCOUNTER — Encounter: Payer: Self-pay | Admitting: *Deleted

## 2020-07-11 DIAGNOSIS — I5022 Chronic systolic (congestive) heart failure: Secondary | ICD-10-CM

## 2020-07-11 NOTE — Progress Notes (Signed)
Cardiac Individual Treatment Plan  Patient Details  Name: Arlon Bleier MRN: [id number] Date of Birth: 1956-03-19 Referring Provider:   Flowsheet Row Cardiac Rehab from 04/12/2020 in [facility] Cardiac and Pulmonary Rehab  Referring Provider [name] MD ([place])      Initial Encounter Date:  Flowsheet Row Cardiac Rehab from 04/12/2020 in [facility] Cardiac and Pulmonary Rehab  Date 04/12/20      Visit Diagnosis: Heart failure, chronic systolic ([place])  Patient's Home Medications on Admission:  Current Outpatient Medications:  .  amLODipine (NORVASC) 10 MG tablet, Take 5 mg by mouth daily. Take 1/2 of 10 mg tab, Disp: , Rfl:  .  apixaban (ELIQUIS) 5 MG TABS tablet, Take 5 mg by mouth 2 (two) times daily. On hold until surgery, Disp: , Rfl:  .  ascorbic acid (VITAMIN C) 500 MG tablet, Take 500 mg by mouth 2 (two) times daily., Disp: , Rfl:  .  atorvastatin (LIPITOR) 80 MG tablet, Take 40 mg by mouth daily., Disp: , Rfl:  .  empagliflozin (JARDIANCE) 25 MG TABS tablet, Take 25 mg by mouth daily. Take one half tablet daily, Disp: , Rfl:  .  ferrous sulfate 325 (65 FE) MG tablet, Take 325 mg by mouth daily with breakfast., Disp: , Rfl:  .  insulin glargine (LANTUS) 100 UNIT/ML injection, Inject 50 Units into the skin daily., Disp: , Rfl:  .  liraglutide (VICTOZA) 18 MG/3ML SOPN, Inject 1.2 mg into the skin daily., Disp: , Rfl:  .  metFORMIN (GLUCOPHAGE) 1000 MG tablet, Take 1,000 mg by mouth 2 (two) times daily with a meal., Disp: , Rfl:  .  metoprolol (TOPROL-XL) 200 MG 24 hr tablet, Take 200 mg by mouth daily., Disp: , Rfl:  .  nicotine (NICODERM CQ - DOSED IN MG/24 HOURS) 14 mg/24hr patch, Place 14 mg onto the skin daily., Disp: , Rfl:  .  nicotine (NICODERM CQ - DOSED IN MG/24 HR) 7 mg/24hr patch, Place 7 mg onto the skin daily. Use after 10m patch completed, Disp: , Rfl:  .  nicotine polacrilex (NICORETTE) 2 MG gum, Take 2 mg by mouth as needed for smoking cessation., Disp: , Rfl:  .  omeprazole  (PRILOSEC) 20 MG capsule, Take 20 mg by mouth 2 (two) times daily., Disp: , Rfl:  .  torsemide (DEMADEX) 20 MG tablet, Take 20 mg by mouth daily., Disp: , Rfl:  .  valsartan (DIOVAN) 320 MG tablet, Take 320 mg by mouth daily., Disp: , Rfl:   Past Medical History: No past medical history on file.  Tobacco Use: Social History   Tobacco Use  Smoking Status Current Some Day Smoker  . Packs/day: 1.00  . Years: 50.00  . Pack years: 50.00  . Types: Cigarettes  Smokeless Tobacco Never Used  Tobacco Comment   Quit date soon     Labs: Recent Review Flowsheet Data   There is no flowsheet data to display.      Exercise Target Goals: Exercise Program Goal: Individual exercise prescription set using results from initial 6 min walk test and THRR while considering  patient's activity barriers and safety.   Exercise Prescription Goal: Initial exercise prescription builds to 30-45 minutes a day of aerobic activity, 2-3 days per week.  Home exercise guidelines will be given to patient during program as part of exercise prescription that the participant will acknowledge.   Education: Aerobic Exercise: - Group verbal and visual presentation on the components of exercise prescription. Introduces F.I.T.T principle from ACSM for exercise  prescriptions.  Reviews F.I.T.T. principles of aerobic exercise including progression. Written material given at graduation.   Education: Resistance Exercise: - Group verbal and visual presentation on the components of exercise prescription. Introduces F.I.T.T principle from ACSM for exercise prescriptions  Reviews F.I.T.T. principles of resistance exercise including progression. Written material given at graduation.    Education: Exercise & Equipment Safety: - Individual verbal instruction and demonstration of equipment use and safety with use of the equipment. Flowsheet Row Cardiac Rehab from 05/03/2020 in [facility] Cardiac and Pulmonary Rehab  Education need  identified 04/12/20  Date 04/12/20  Educator [place]  Instruction Review Code 1- Verbalizes Understanding      Education: Exercise Physiology & General Exercise Guidelines: - Group verbal and written instruction with models to review the exercise physiology of the cardiovascular system and associated critical values. Provides general exercise guidelines with specific guidelines to those with heart or lung disease.  Flowsheet Row Cardiac Rehab from 05/03/2020 in [facility] Cardiac and Pulmonary Rehab  Date 05/03/20  Educator [facility]  Instruction Review Code 1- Verbalizes Understanding      Education: Flexibility, Balance, Mind/Body Relaxation: - Group verbal and visual presentation with interactive activity on the components of exercise prescription. Introduces F.I.T.T principle from ACSM for exercise prescriptions. Reviews F.I.T.T. principles of flexibility and balance exercise training including progression. Also discusses the mind body connection.  Reviews various relaxation techniques to help reduce and manage stress (i.e. Deep breathing, progressive muscle relaxation, and visualization). Balance handout provided to take home. Written material given at graduation.   Activity Barriers & Risk Stratification:  Activity Barriers & Cardiac Risk Stratification - 04/12/20 1350      Activity Barriers & Cardiac Risk Stratification   Activity Barriers Arthritis    Cardiac Risk Stratification High           6 Minute Walk:  6 Minute Walk    Row Name 04/12/20 1349         6 Minute Walk   Phase Initial     Distance 1180 feet     Walk Time 6 minutes     # of Rest Breaks 0     MPH 2.23     METS 2.7     RPE 7     Perceived Dyspnea  0     VO2 Peak 9.45     Symptoms No     Resting HR 80 bpm     Resting BP 116/64     Resting Oxygen Saturation  97 %     Exercise Oxygen Saturation  during 6 min walk 93 %     Max Ex. HR 91 bpm     Max Ex. BP 140/66     2 Minute Post BP 122/66            Oxygen  Initial Assessment:   Oxygen Re-Evaluation:   Oxygen Discharge (Final Oxygen Re-Evaluation):   Initial Exercise Prescription:  Initial Exercise Prescription - 04/12/20 1300      Date of Initial Exercise RX and Referring Provider   Date 04/12/20    Referring Provider [name] MD ([facility])      Treadmill   MPH 2.3    Grade 0.5    Minutes 15    METs 2.92      Recumbant Bike   Level 3    RPM 60    Watts 20    Minutes 15    METs 2.7      NuStep   Level 2  SPM 80    Minutes 15    METs 2.7      T5 Nustep   Level 2    SPM 80    Minutes 15    METs 2.7      Prescription Details   Frequency (times per week) 2    Duration Progress to 30 minutes of continuous aerobic without signs/symptoms of physical distress      Intensity   THRR 40-80% of Max Heartrate 110-141    Ratings of Perceived Exertion 11-13    Perceived Dyspnea 0-4      Progression   Progression Continue to progress workloads to maintain intensity without signs/symptoms of physical distress.      Resistance Training   Training Prescription Yes    Weight 3 lb    Reps 10-15           Perform Capillary Blood Glucose checks as needed.  Exercise Prescription Changes:  Exercise Prescription Changes    Row Name 04/12/20 1400 04/25/20 1300 05/08/20 1500 05/21/20 1100 06/06/20 1000     Response to Exercise   Blood Pressure (Admit) 116/64 128/72 134/66 144/78 120/78   Blood Pressure (Exercise) 140/66 150/80 144/66 138/76 130/62   Blood Pressure (Exit) 122/66 130/72 132/68 142/80 104/60   Heart Rate (Admit) 80 bpm 75 bpm 88 bpm 83 bpm 67 bpm   Heart Rate (Exercise) 91 bpm 97 bpm 91 bpm 94 bpm 95 bpm   Heart Rate (Exit) 81 bpm 78 bpm 85 bpm 85 bpm 80 bpm   Oxygen Saturation (Admit) 97 % -- -- -- --   Oxygen Saturation (Exercise) 93 % -- -- -- --   Oxygen Saturation (Exit) 97 % -- -- -- --   Rating of Perceived Exertion (Exercise) 7 13 15 14 15    Perceived Dyspnea (Exercise) 0 -- -- -- --   Symptoms  none none none none none   Comments walk test results second day -- -- --   Duration -- Progress to 30 minutes of  aerobic without signs/symptoms of physical distress Continue with 30 min of aerobic exercise without signs/symptoms of physical distress. Continue with 30 min of aerobic exercise without signs/symptoms of physical distress. Continue with 30 min of aerobic exercise without signs/symptoms of physical distress.   Intensity -- THRR unchanged THRR unchanged THRR unchanged THRR unchanged     Progression   Progression -- Continue to progress workloads to maintain intensity without signs/symptoms of physical distress. Continue to progress workloads to maintain intensity without signs/symptoms of physical distress. Continue to progress workloads to maintain intensity without signs/symptoms of physical distress. Continue to progress workloads to maintain intensity without signs/symptoms of physical distress.   Average METs -- 2.7 2.8 3.3 3.02     Resistance Training   Training Prescription Yes Yes Yes Yes Yes   Weight 3 lb 3 lb 4 lb 4 lb 5 lb   Reps 10-15 10-15 10-15 10-15 10-15     Interval Training   Interval Training -- -- No No No     Treadmill   MPH 2.3 -- 1.7 -- 2.2   Grade 0.5 -- 0 -- 0   Minutes 15 -- 15 -- 15   METs 2.92 -- 2.3 -- 2.68     Recumbant Bike   Level 3 3 4 6 6    RPM 60 60 -- -- --   Watts 20 29 32 -- 47   Minutes 15 15 15 15 15    METs 2.7 2.86 2.9 2.6  3.28     NuStep   Level $Remo'2 3 4 5 'yiJay$ --   SPM 80 80 -- 80 --   Minutes $Remove'15 15 15 15 'wnSPWkd$ --   METs 2.7 2.3 3.3 4 --     Arm Ergometer   Level -- -- -- -- 1   Minutes -- -- -- -- 15     Elliptical   Level -- -- -- -- 1   Speed -- -- -- -- 2.5   Minutes -- -- -- -- 4     T5 Nustep   Level 2 -- 3 -- --   SPM 80 -- -- -- --   Minutes 15 -- 15 -- --   METs 2.7 -- 2.7 -- --     Home Exercise Plan   Plans to continue exercise at -- -- [facility]  and walking at home [facility]  and walking at  home [facility]  and walking at home   Frequency -- -- Add 2 additional days to program exercise sessions. Add 2 additional days to program exercise sessions. Add 2 additional days to program exercise sessions.   Initial Home Exercises Provided -- -- 04/26/20 04/26/20 04/26/20          Exercise Comments:  Exercise Comments    Row Name 04/17/20 0931           Exercise Comments First full day of exercise!  Patient was oriented to gym and equipment including functions, settings, policies, and procedures.  Patient's individual exercise prescription and treatment plan were reviewed.  All starting workloads were established based on the results of the 6 minute walk test done at initial orientation visit.  The plan for exercise progression was also introduced and progression will be customized based on patient's performance and goals.              Exercise Goals and Review:  Exercise Goals    Row Name 04/12/20 1406             Exercise Goals   Increase Physical Activity Yes       Intervention Provide advice, education, support and counseling about physical activity/exercise needs.;Develop an individualized exercise prescription for aerobic and resistive training based on initial evaluation findings, risk stratification, comorbidities and participant's personal goals.       Expected Outcomes Short Term: Attend rehab on a regular basis to increase amount of physical activity.;Long Term: Add in home exercise to make exercise part of routine and to increase amount of physical activity.;Long Term: Exercising regularly at least 3-5 days a week.       Increase Strength and Stamina Yes       Intervention Provide advice, education, support and counseling about physical activity/exercise needs.;Develop an individualized exercise prescription for aerobic and resistive training based on initial evaluation findings, risk stratification, comorbidities and participant's personal goals.        Expected Outcomes Short Term: Increase workloads from initial exercise prescription for resistance, speed, and METs.;Short Term: Perform resistance training exercises routinely during rehab and add in resistance training at home;Long Term: Improve cardiorespiratory fitness, muscular endurance and strength as measured by increased METs and functional capacity (6MWT)       Able to understand and use rate of perceived exertion (RPE) scale Yes       Intervention Provide education and explanation on how to use RPE scale       Expected Outcomes Short Term: Able to use RPE daily in rehab  to express subjective intensity level;Long Term:  Able to use RPE to guide intensity level when exercising independently       Able to understand and use Dyspnea scale Yes       Intervention Provide education and explanation on how to use Dyspnea scale       Expected Outcomes Short Term: Able to use Dyspnea scale daily in rehab to express subjective sense of shortness of breath during exertion;Long Term: Able to use Dyspnea scale to guide intensity level when exercising independently       Knowledge and understanding of Target Heart Rate Range (THRR) Yes       Intervention Provide education and explanation of THRR including how the numbers were predicted and where they are located for reference       Expected Outcomes Short Term: Able to state/look up THRR;Short Term: Able to use daily as guideline for intensity in rehab;Long Term: Able to use THRR to govern intensity when exercising independently       Able to check pulse independently Yes       Intervention Provide education and demonstration on how to check pulse in carotid and radial arteries.;Review the importance of being able to check your own pulse for safety during independent exercise       Expected Outcomes Short Term: Able to explain why pulse checking is important during independent exercise;Long Term: Able to check pulse independently and accurately        Understanding of Exercise Prescription Yes       Intervention Provide education, explanation, and written materials on patient's individual exercise prescription       Expected Outcomes Short Term: Able to explain program exercise prescription;Long Term: Able to explain home exercise prescription to exercise independently              Exercise Goals Re-Evaluation :  Exercise Goals Re-Evaluation    Row Name 04/17/20 0932 04/25/20 1345 04/26/20 0945 05/08/20 1549 05/21/20 1145     Exercise Goal Re-Evaluation   Exercise Goals Review Able to understand and use rate of perceived exertion (RPE) scale;Able to understand and use Dyspnea scale;Knowledge and understanding of Target Heart Rate Range (THRR);Understanding of Exercise Prescription Increase Physical Activity;Increase Strength and Stamina Increase Physical Activity;Increase Strength and Stamina;Able to understand and use rate of perceived exertion (RPE) scale;Knowledge and understanding of Target Heart Rate Range (THRR);Able to check pulse independently Increase Physical Activity;Increase Strength and Stamina;Understanding of Exercise Prescription Increase Physical Activity;Increase Strength and Stamina   Comments Reviewed RPE and dyspnea scales, THR and program prescription with pt today.  Pt voiced understanding and was given a copy of goals to take home. Jaray is off to a good start in rehab.  We will continue to monitor progress. Reviewed home exercise with pt today.  Pt plans to walk for exercise.  Reviewed THR, pulse, RPE, sign and symptoms, pulse oximetery and when to call 911 or MD.  Also discussed weather considerations and indoor options.  Pt voiced understanding. [place] is doing well in rehab.  He is up to 32 watts on the bike.  We will continue to monitor his progress. Moises continues to do well.  He has increased level on RB.   Expected Outcomes Short: Use RPE daily to regulate intensity. Long: Follow program prescription in THR. Short:  attend rehab consistently Long:  improve overall stamina Short: practice mointoring HR whil exercising Long:  increase MET level Short: Increase workload on treadmill Long: Continue to improve stamina. --  [place] Name 05/21/20 1147 05/22/20 0932 06/06/20 1046 07/02/20 1443       Exercise Goal Re-Evaluation   Exercise Goals Review -- Increase Physical Activity;Increase Strength and Stamina Increase Physical Activity;Increase Strength and Stamina;Understanding of Exercise Prescription --    Comments He has aso increased levels on TM. He reports walking at home (2.5 miles RPE 12) and doing balance exercise and handweights (5lbs) - 2x/week. He warms up and cools down at home when he is walking. Asiel is doing well in rehab.  He was able to get to 4 min on the ellipitcal and is up to 47 watts on the bike..  We will continue to monitor his progress. Out since last review, surgery scheduled for 4/29.    Expected Outcomes Short: continue to progress workloads Long:  increase overall MET level ST: continue to exercise consistently at home LT: increase MET levels Short: Return to regularly attendance Long: Continue to improve stamina. --           Discharge Exercise Prescription (Final Exercise Prescription Changes):  Exercise Prescription Changes - 06/06/20 1000      Response to Exercise   Blood Pressure (Admit) 120/78    Blood Pressure (Exercise) 130/62    Blood Pressure (Exit) 104/60    Heart Rate (Admit) 67 bpm    Heart Rate (Exercise) 95 bpm    Heart Rate (Exit) 80 bpm    Rating of Perceived Exertion (Exercise) 15    Symptoms none    Duration Continue with 30 min of aerobic exercise without signs/symptoms of physical distress.    Intensity THRR unchanged      Progression   Progression Continue to progress workloads to maintain intensity without signs/symptoms of physical distress.    Average METs 3.02      Resistance Training   Training Prescription Yes    Weight 5 lb    Reps 10-15       Interval Training   Interval Training No      Treadmill   MPH 2.2    Grade 0    Minutes 15    METs 2.68      Recumbant Bike   Level 6    Watts 47    Minutes 15    METs 3.28      Arm Ergometer   Level 1    Minutes 15      Elliptical   Level 1    Speed 2.5    Minutes 4      Home Exercise Plan   Plans to continue exercise at [facility]   and walking at home   Frequency Add 2 additional days to program exercise sessions.    Initial Home Exercises Provided 04/26/20           Nutrition:  Target Goals: Understanding of nutrition guidelines, daily intake of sodium '1500mg'$ , cholesterol '200mg'$ , calories 30% from fat and 7% or less from saturated fats, daily to have 5 or more servings of fruits and vegetables.  Education: All About Nutrition: -Group instruction provided by verbal, written material, interactive activities, discussions, models, and posters to present general guidelines for heart healthy nutrition including fat, fiber, MyPlate, the role of sodium in heart healthy nutrition, utilization of the nutrition label, and utilization of this knowledge for meal planning. Follow up email sent as well. Written material given at graduation.   Biometrics:  Pre Biometrics - 04/12/20 1407      Pre Biometrics   Height 5' 10.25" (1.784 m)  Weight 236 lb 11.2 oz (107.4 kg)    BMI (Calculated) 33.73    Single Leg Stand 6.28 seconds            Nutrition Therapy Plan and Nutrition Goals:  Nutrition Therapy & Goals - 04/25/20 0846      Nutrition Therapy   Diet Heart healthy, low Na    Drug/Food Interactions Statins/Certain Fruits    Protein (specify units) 85g    Fiber 30 grams    Whole Grain Foods 3 servings    Saturated Fats 12 max. grams    Fruits and Vegetables 8 servings/day    Sodium 1.5 grams      Personal Nutrition Goals   Nutrition Goal ST: for lunch have tuna or chicken salad instead of fast food LT: limit saturated fat to <12g/day    Comments  Coffee (equal and creamer) B: eggs with bacon, sausage, or ham L: take out (fast food) - burger D: frozen dinner  or cook (2x/week - he also eats leftovers) chicken and pork, spaghetti, soup, sometimes shrimp or fish. He has potatoes, peas, carrots, fried onions, can of mushrooms, beans (all), greens. He does not cook with salt, he adds a bit a sea salt at the end, but not always. He uses vegetable oil, he uses butter with grits. Drinks: water S: pork rinds and potato chips. He doesn't eat bread [facility] not [facility] and now he eats saltines. He tries to keep fruit like grapes and oranges. His teeth prevent him from having anything too hard. Discussed heart healthy eating. Discussed whole grains and suggested whole grain crackers instead of saltines if he doesn't care for bread. Suggested replacing lunch fast food with something prepared at home. Suggested limiting processed meat and pork rinds. Suggested more variety in vegetables (more non-starchy).      Intervention Plan   Intervention Prescribe, educate and counsel regarding individualized specific dietary modifications aiming towards targeted core components such as weight, hypertension, lipid management, diabetes, heart failure and other comorbidities.;Nutrition handout(s) given to patient.    Expected Outcomes Short Term Goal: Understand basic principles of dietary content, such as calories, fat, sodium, cholesterol and nutrients.;Short Term Goal: A plan has been developed with personal nutrition goals set during dietitian appointment.;Long Term Goal: Adherence to prescribed nutrition plan.           Nutrition Assessments:  MEDIFICTS Score Key:  ?70 Need to make dietary changes   40-70 Heart Healthy Diet  ? 40 Therapeutic Level Cholesterol Diet  Flowsheet Row Cardiac Rehab from 04/17/2020 in [facility] Cardiac and Pulmonary Rehab  Picture Your Plate Total Score on Admission 45     Picture Your Plate Scores:  <11 Unhealthy dietary  pattern with much room for improvement.  41-50 Dietary pattern unlikely to meet recommendations for good health and room for improvement.  51-60 More healthful dietary pattern, with some room for improvement.   >60 Healthy dietary pattern, although there may be some specific behaviors that could be improved.    Nutrition Goals Re-Evaluation:  Nutrition Goals Re-Evaluation    [place] Name 05/22/20 [phone]             Goals   Nutrition Goal ST: continue with current changes LT: limit saturated fat to <12g/day       Comment He reports having chicken salad and tuna salad for lunch, has oatmeal in the morning, and small piece of meat with vegetables for dinner. He reports trying to go to the dentist  to get false teeth to eat more. He would not like to make any further changes at this time and would like to continue with the changes he has made so far.       Expected Outcome ST: continue with current changes LT: limit saturated fat to <12g/day              Nutrition Goals Discharge (Final Nutrition Goals Re-Evaluation):  Nutrition Goals Re-Evaluation - 05/22/20 0939      Goals   Nutrition Goal ST: continue with current changes LT: limit saturated fat to <12g/day    Comment He reports having chicken salad and tuna salad for lunch, has oatmeal in the morning, and small piece of meat with vegetables for dinner. He reports trying to go to the dentist to get false teeth to eat more. He would not like to make any further changes at this time and would like to continue with the changes he has made so far.    Expected Outcome ST: continue with current changes LT: limit saturated fat to <12g/day           Psychosocial: Target Goals: Acknowledge presence or absence of significant depression and/or stress, maximize coping skills, provide positive support system. Participant is able to verbalize types and ability to use techniques and skills needed for reducing stress and depression.   Education:  Stress, Anxiety, and Depression - Group verbal and visual presentation to define topics covered.  Reviews how body is impacted by stress, anxiety, and depression.  Also discusses healthy ways to reduce stress and to treat/manage anxiety and depression.  Written material given at graduation. Flowsheet Row Cardiac Rehab from 05/03/2020 in [facility] Cardiac and Pulmonary Rehab  Date 04/26/20  Educator [facility]  Instruction Review Code 1- Verbalizes Understanding      Education: Sleep Hygiene -Provides group verbal and written instruction about how sleep can affect your health.  Define sleep hygiene, discuss sleep cycles and impact of sleep habits. Review good sleep hygiene tips.    Initial Review & Psychosocial Screening:  Initial Psych Review & Screening - 04/03/20 1553      Initial Review   Current issues with None Identified      Family Dynamics   Good Support System? Yes   cousin, friends, lots of kinfolks     Barriers   Psychosocial barriers to participate in program There are no identifiable barriers or psychosocial needs.;The patient should benefit from training in stress management and relaxation.      Screening Interventions   Interventions Encouraged to exercise;To provide support and resources with identified psychosocial needs;Provide feedback about the scores to participant    Expected Outcomes Short Term goal: Utilizing psychosocial counselor, staff and physician to assist with identification of specific Stressors or current issues interfering with healing process. Setting desired goal for each stressor or current issue identified.;Long Term Goal: Stressors or current issues are controlled or eliminated.;Short Term goal: Identification and review with participant of any Quality of Life or Depression concerns found by scoring the questionnaire.;Long Term goal: The participant improves quality of Life and PHQ9 Scores as seen by post scores and/or verbalization of changes            Quality of Life Scores:   Quality of Life - 04/12/20 1130      Quality of Life   Select Quality of Life      Quality of Life Scores   Health/Function Pre 16.33 %    Socioeconomic Pre 18.25 %  Psych/Spiritual Pre 18.43 %    Family Pre 16.25 %    GLOBAL Pre 17.21 %          Scores of 19 and below usually indicate a poorer quality of life in these areas.  A difference of  2-3 points is a clinically meaningful difference.  A difference of 2-3 points in the total score of the Quality of Life Index has been associated with significant improvement in overall quality of life, self-image, physical symptoms, and general health in studies assessing change in quality of life.  PHQ-9: Recent Review Flowsheet Data    Depression screen [facility] 2/9 04/12/2020   Decreased Interest 0   Down, Depressed, Hopeless 0   PHQ - 2 Score 0   Altered sleeping 0   Tired, decreased energy 1   Change in appetite 0   Feeling bad or failure about yourself  0   Trouble concentrating 0   Moving slowly or fidgety/restless 0   Suicidal thoughts 0   PHQ-9 Score 1   Difficult doing work/chores Not difficult at all     Interpretation of Total Score  Total Score Depression Severity:  1-4 = Minimal depression, 5-9 = Mild depression, 10-14 = Moderate depression, 15-19 = Moderately severe depression, 20-27 = Severe depression   Psychosocial Evaluation and Intervention:  Psychosocial Evaluation - 04/03/20 1608      Psychosocial Evaluation & Interventions   Interventions Encouraged to exercise with the program and follow exercise prescription    Comments Teodoro is ready to attend the prgram without any barriers. He lives alone and has no needs. He spends his time watching TV or sitting on the porch watching the cars go by. He has friends that drop in to see him. He is trying to Quit tobacco for an upcoming surgery. He has quit before and feels he can do this. He has nicotine patches and gum being sent to him from  the [place]. He should do well with the program, as he is looking forward to getting stronger and quitting tobacco.    Expected Outcomes STG; Liliane Channel attends all session scheduled LTG: Reznor is able to accomplish tobacco cessation and then maintain it after discharge    Continue Psychosocial Services  Follow up required by staff           Psychosocial Re-Evaluation:  Psychosocial Re-Evaluation    [place] Name 04/26/20 0934 05/22/20 0935           Psychosocial Re-Evaluation   Current issues with Current Stress Concerns;Current Sleep Concerns --      Comments Nayel reports no new stress concerns.  He says he sleeps some during the day.  He has trouble staying asleep sometimes.  He usually wakes up to go to the bathroom. He goes back to sleep easily. Alie reports no new stress concerns.  He reports he is a good napper and will sleep sometimes during the day.  He has trouble staying asleep sometimes due to going to the bathroom - he goes back to sleep easily. He reports he is bored sometimes at home due to the cold and is excited for the warm weather activities.      Expected Outcomes Short: continue to attend class for exercise Long: maintain positive outlook Short: continue to attend class for exercise Long: maintain positive outlook      Interventions -- Encouraged to attend Cardiac Rehabilitation for the exercise      Continue Psychosocial Services  -- Follow up required by staff  Psychosocial Discharge (Final Psychosocial Re-Evaluation):  Psychosocial Re-Evaluation - 05/22/20 0935      Psychosocial Re-Evaluation   Comments Tayshon reports no new stress concerns.  He reports he is a good napper and will sleep sometimes during the day.  He has trouble staying asleep sometimes due to going to the bathroom - he goes back to sleep easily. He reports he is bored sometimes at home due to the cold and is excited for the warm weather activities.    Expected Outcomes Short: continue to attend  class for exercise Long: maintain positive outlook    Interventions Encouraged to attend Cardiac Rehabilitation for the exercise    Continue Psychosocial Services  Follow up required by staff           Vocational Rehabilitation: Provide vocational rehab assistance to qualifying candidates.   Vocational Rehab Evaluation & Intervention:  Vocational Rehab - 04/03/20 1555      Initial Vocational Rehab Evaluation & Intervention   Assessment shows need for Vocational Rehabilitation No           Education: Education Goals: Education classes will be provided on a variety of topics geared toward better understanding of heart health and risk factor modification. Participant will state understanding/return demonstration of topics presented as noted by education test scores.  Learning Barriers/Preferences:  Learning Barriers/Preferences - 04/03/20 1554      Learning Barriers/Preferences   Learning Barriers None    Learning Preferences None           General Cardiac Education Topics:  AED/CPR: - Group verbal and written instruction with the use of models to demonstrate the basic use of the AED with the basic ABC's of resuscitation.   Anatomy and Cardiac Procedures: - Group verbal and visual presentation and models provide information about basic cardiac anatomy and function. Reviews the testing methods done to diagnose heart disease and the outcomes of the test results. Describes the treatment choices: Medical Management, Angioplasty, or Coronary Bypass Surgery for treating various heart conditions including Myocardial Infarction, Angina, Valve Disease, and Cardiac Arrhythmias.  Written material given at graduation.   Medication Safety: - Group verbal and visual instruction to review commonly prescribed medications for heart and lung disease. Reviews the medication, class of the drug, and side effects. Includes the steps to properly store meds and maintain the prescription regimen.   Written material given at graduation.   Intimacy: - Group verbal instruction through game format to discuss how heart and lung disease can affect sexual intimacy. Written material given at graduation..   Know Your Numbers and Heart Failure: - Group verbal and visual instruction to discuss disease risk factors for cardiac and pulmonary disease and treatment options.  Reviews associated critical values for Overweight/Obesity, Hypertension, Cholesterol, and Diabetes.  Discusses basics of heart failure: signs/symptoms and treatments.  Introduces Heart Failure Zone chart for action plan for heart failure.  Written material given at graduation.   Infection Prevention: - Provides verbal and written material to individual with discussion of infection control including proper hand washing and proper equipment cleaning during exercise session. Flowsheet Row Cardiac Rehab from 05/03/2020 in [facility] Cardiac and Pulmonary Rehab  Education need identified 04/12/20  Date 04/12/20  Educator [place]  Instruction Review Code 1- Verbalizes Understanding      Falls Prevention: - Provides verbal and written material to individual with discussion of falls prevention and safety. Flowsheet Row Cardiac Rehab from 04/03/2020 in [facility] Cardiac and Pulmonary Rehab  Date 04/03/20  Educator SB  Instruction  Review Code 1- Verbalizes Understanding      Other: -Provides group and verbal instruction on various topics (see comments)   Knowledge Questionnaire Score:  Knowledge Questionnaire Score - 04/12/20 1127      Knowledge Questionnaire Score   Pre Score 23/26: Angina, Heart disease, Exercise           Core Components/Risk Factors/Patient Goals at Admission:  Personal Goals and Risk Factors at Admission - 04/12/20 1408      Core Components/Risk Factors/Patient Goals on Admission    Weight Management Weight Loss    Intervention Weight Management: Develop a combined nutrition and exercise program designed to  reach desired caloric intake, while maintaining appropriate intake of nutrient and fiber, sodium and fats, and appropriate energy expenditure required for the weight goal.;Weight Management/Obesity: Establish reasonable short term and long term weight goals.    Admit Weight 236 lb (107 kg)    Goal Weight: Short Term 231 lb (104.8 kg)    Goal Weight: Long Term 190 lb (86.2 kg)    Expected Outcomes Short Term: Continue to assess and modify interventions until short term weight is achieved;Long Term: Adherence to nutrition and physical activity/exercise program aimed toward attainment of established weight goal;Weight Loss: Understanding of general recommendations for a balanced deficit meal plan, which promotes 1-2 lb weight loss per week and includes a negative energy balance of [phone] kcal/d;Understanding recommendations for meals to include 15-35% energy as protein, 25-35% energy from fat, 35-60% energy from carbohydrates, less than $RemoveB'200mg'PVmINsEw$  of dietary cholesterol, 20-35 gm of total fiber daily;Understanding of distribution of calorie intake throughout the day with the consumption of 4-5 meals/snacks    Tobacco Cessation Yes   Trying the nicotine patches, does not have a quit date but thinking about quitting   Number of packs per day Antero is a current tobacco user. Intervention for tobacco cessation was provided at the initial medical review. He was asked about readiness to quit and reported he is ready to quit . Patient was advised and educated about tobacco cessation using combination therapy, tobacco cessation classes, quit line, and quit smoking apps. Patient demonstrated understanding of this material. Staff will continue to provide encouragement and follow up with the patient throughout the program. Tanner currently smokes about 1/2 pack/day.    Intervention Assist the participant in steps to quit. Provide individualized education and counseling about committing to Tobacco Cessation, relapse prevention,  and pharmacological support that can be provided by physician.;Advice worker, assist with locating and accessing local/national Quit Smoking programs, and support quit date choice.    Expected Outcomes Short Term: Will demonstrate readiness to quit, by selecting a quit date.;Short Term: Will quit all tobacco product use, adhering to prevention of relapse plan.;Long Term: Complete abstinence from all tobacco products for at least 12 months from quit date.    Diabetes Yes    Intervention Provide education about signs/symptoms and action to take for hypo/hyperglycemia.;Provide education about proper nutrition, including hydration, and aerobic/resistive exercise prescription along with prescribed medications to achieve blood glucose in normal ranges: Fasting glucose 65-99 mg/dL    Expected Outcomes Short Term: Participant verbalizes understanding of the signs/symptoms and immediate care of hyper/hypoglycemia, proper foot care and importance of medication, aerobic/resistive exercise and nutrition plan for blood glucose control.;Long Term: Attainment of HbA1C < 7%.    Heart Failure Yes    Intervention Provide a combined exercise and nutrition program that is supplemented with education, support and counseling about heart failure. Directed toward relieving symptoms such  as shortness of breath, decreased exercise tolerance, and extremity edema.    Expected Outcomes Improve functional capacity of life;Short term: Attendance in program 2-3 days a week with increased exercise capacity. Reported lower sodium intake. Reported increased fruit and vegetable intake. Reports medication compliance.;Short term: Daily weights obtained and reported for increase. Utilizing diuretic protocols set by physician.;Long term: Adoption of self-care skills and reduction of barriers for early signs and symptoms recognition and intervention leading to self-care maintenance.    Hypertension Yes    Intervention Provide  education on lifestyle modifcations including regular physical activity/exercise, weight management, moderate sodium restriction and increased consumption of fresh fruit, vegetables, and low fat dairy, alcohol moderation, and smoking cessation.;Monitor prescription use compliance.    Expected Outcomes Short Term: Continued assessment and intervention until BP is < 140/58mm HG in hypertensive participants. < 130/64mm HG in hypertensive participants with diabetes, heart failure or chronic kidney disease.;Long Term: Maintenance of blood pressure at goal levels.    Lipids Yes    Intervention Provide education and support for participant on nutrition & aerobic/resistive exercise along with prescribed medications to achieve LDL '70mg'$ , HDL >$Remo'40mg'SCLiz$ .    Expected Outcomes Short Term: Participant states understanding of desired cholesterol values and is compliant with medications prescribed. Participant is following exercise prescription and nutrition guidelines.;Long Term: Cholesterol controlled with medications as prescribed, with individualized exercise RX and with personalized nutrition plan. Value goals: LDL < $Rem'70mg'GkWL$ , HDL > 40 mg.           Education:Diabetes - Individual verbal and written instruction to review signs/symptoms of diabetes, desired ranges of glucose level fasting, after meals and with exercise. Acknowledge that pre and post exercise glucose checks will be done for 3 sessions at entry of program. [place] from 05/03/2020 in [facility] Cardiac and Pulmonary Rehab  Education need identified 04/12/20  Date 04/12/20  Educator [place]  Instruction Review Code 1- Verbalizes Understanding      Core Components/Risk Factors/Patient Goals Review:   Goals and Risk Factor Review    Row Name 04/26/20 0931 05/22/20 0937           Core Components/Risk Factors/Patient Goals Review   Personal Goals Review Diabetes;Hypertension;Tobacco Cessation Diabetes;Hypertension;Tobacco Cessation       Review Mika states he is taking medications as directed.  He monitors BG and BP at home daily.  FBG averages 174.  resting BP today 112/70.  He has reduced to 5 cigarettes.  He has thoracic suregry for esophageal cancer coming up and will have to stop smoking at that point.  He does use a patch sometimes. Krystle is taking his medications as directed. BG is about 120 in the am; can be 110 to 200 (once every two weeks around 200 if he ate something sweet the night before. He still checks his BP at home 130/80. He reports he is now down to 2 cigarettes and his quit date is his surgery date 3/29.      Expected Outcomes Short:  continue to monitor raisk factors Long: quit smoking Short:  continue to monitor raisk factors, continue to check BP and BG at home  Long: quit smoking             Core Components/Risk Factors/Patient Goals at Discharge (Final Review):   Goals and Risk Factor Review - 05/22/20 0937      Core Components/Risk Factors/Patient Goals Review   Personal Goals Review Diabetes;Hypertension;Tobacco Cessation    Review Oaklyn is taking his medications as directed. BG is  about 120 in the am; can be 110 to 200 (once every two weeks around 200 if he ate something sweet the night before. He still checks his BP at home 130/80. He reports he is now down to 2 cigarettes and his quit date is his surgery date 3/29.    Expected Outcomes Short:  continue to monitor raisk factors, continue to check BP and BG at home  Long: quit smoking           ITP Comments:  ITP Comments    Row Name 04/03/20 1615 04/12/20 1201 04/17/20 0931 04/18/20 0959 04/25/20 0910   ITP Comments irtual orientation call completed today. he has an appointment on Date: 04/09/2020 for EP eval and gym Orientation.  Documentation of diagnosis can be found in media scan [facility] records  .   Bessie is a current tobacco user. Intervention for tobacco cessation was provided at the initial medical review. He was asked about readiness to quit  and reported he is ready to quit . Patient was advised and educated about tobacco cessation using combination therapy, tobacco cessation classes, quit line, and quit smoking apps. Patient demonstrated understanding of this material. Staff will continue to provide encouragement and follow up with the patient throughout the program. Completed 6MWT and gym orientation. Initial ITP created and sent for review to Dr. Ramonita Lab.. First full day of exercise!  Patient was oriented to gym and equipment including functions, settings, policies, and procedures.  Patient's individual exercise prescription and treatment plan were reviewed.  All starting workloads were established based on the results of the 6 minute walk test done at initial orientation visit.  The plan for exercise progression was also introduced and progression will be customized based on patient's performance and goals. 30 Day review completed. Medical Director ITP review done, changes made as directed, and signed approval by Medical Director.   New to program Completed initial RD evaluation   Row Name 05/16/20 0715 06/13/20 1129 06/14/20 1105 06/19/20 1348 07/02/20 1442   ITP Comments 30 Day review completed. Medical Director ITP review done, changes made as directed, and signed approval by Medical Director. 30 Day review completed. Medical Director ITP review done, changes made as directed, and signed approval by Medical Director. Called patient as he has not been to cardiac rehab in a couple of weeks. Patient states that he has a surgery scheduled for 3/31 and will not be coming for the time being. He will need to be put on a medical hold until he has clearance to come back to exercise. Patient is aware. Nicolaus has not attended since last review due to other medical issues. Surgery scheduled for 4/29.   [place] Name 07/11/20 0641           ITP Comments 30 Day review completed. Medical Director ITP review done, changes made as directed, and signed  approval by Medical Director. out for medical reasons              Comments:

## 2020-07-31 ENCOUNTER — Encounter: Payer: Self-pay | Admitting: *Deleted

## 2020-07-31 DIAGNOSIS — I5022 Chronic systolic (congestive) heart failure: Secondary | ICD-10-CM

## 2020-07-31 NOTE — Progress Notes (Signed)
Cardiac Individual Treatment Plan  Patient Details  Name: Hunter Tran MRN: [id number] Date of Birth: 1956-03-19 Referring Provider:   Flowsheet Row Cardiac Rehab from 04/12/2020 in [facility] Cardiac and Pulmonary Rehab  Referring Provider [name] MD ([place])      Initial Encounter Date:  Flowsheet Row Cardiac Rehab from 04/12/2020 in [facility] Cardiac and Pulmonary Rehab  Date 04/12/20      Visit Diagnosis: Heart failure, chronic systolic ([place])  Patient's Home Medications on Admission:  Current Outpatient Medications:  .  amLODipine (NORVASC) 10 MG tablet, Take 5 mg by mouth daily. Take 1/2 of 10 mg tab, Disp: , Rfl:  .  apixaban (ELIQUIS) 5 MG TABS tablet, Take 5 mg by mouth 2 (two) times daily. On hold until surgery, Disp: , Rfl:  .  ascorbic acid (VITAMIN C) 500 MG tablet, Take 500 mg by mouth 2 (two) times daily., Disp: , Rfl:  .  atorvastatin (LIPITOR) 80 MG tablet, Take 40 mg by mouth daily., Disp: , Rfl:  .  empagliflozin (JARDIANCE) 25 MG TABS tablet, Take 25 mg by mouth daily. Take one half tablet daily, Disp: , Rfl:  .  ferrous sulfate 325 (65 FE) MG tablet, Take 325 mg by mouth daily with breakfast., Disp: , Rfl:  .  insulin glargine (LANTUS) 100 UNIT/ML injection, Inject 50 Units into the skin daily., Disp: , Rfl:  .  liraglutide (VICTOZA) 18 MG/3ML SOPN, Inject 1.2 mg into the skin daily., Disp: , Rfl:  .  metFORMIN (GLUCOPHAGE) 1000 MG tablet, Take 1,000 mg by mouth 2 (two) times daily with a meal., Disp: , Rfl:  .  metoprolol (TOPROL-XL) 200 MG 24 hr tablet, Take 200 mg by mouth daily., Disp: , Rfl:  .  nicotine (NICODERM CQ - DOSED IN MG/24 HOURS) 14 mg/24hr patch, Place 14 mg onto the skin daily., Disp: , Rfl:  .  nicotine (NICODERM CQ - DOSED IN MG/24 HR) 7 mg/24hr patch, Place 7 mg onto the skin daily. Use after 10m patch completed, Disp: , Rfl:  .  nicotine polacrilex (NICORETTE) 2 MG gum, Take 2 mg by mouth as needed for smoking cessation., Disp: , Rfl:  .  omeprazole  (PRILOSEC) 20 MG capsule, Take 20 mg by mouth 2 (two) times daily., Disp: , Rfl:  .  torsemide (DEMADEX) 20 MG tablet, Take 20 mg by mouth daily., Disp: , Rfl:  .  valsartan (DIOVAN) 320 MG tablet, Take 320 mg by mouth daily., Disp: , Rfl:   Past Medical History: No past medical history on file.  Tobacco Use: Social History   Tobacco Use  Smoking Status Current Some Day Smoker  . Packs/day: 1.00  . Years: 50.00  . Pack years: 50.00  . Types: Cigarettes  Smokeless Tobacco Never Used  Tobacco Comment   Quit date soon     Labs: Recent Review Flowsheet Data   There is no flowsheet data to display.      Exercise Target Goals: Exercise Program Goal: Individual exercise prescription set using results from initial 6 min walk test and THRR while considering  patient's activity barriers and safety.   Exercise Prescription Goal: Initial exercise prescription builds to 30-45 minutes a day of aerobic activity, 2-3 days per week.  Home exercise guidelines will be given to patient during program as part of exercise prescription that the participant will acknowledge.   Education: Aerobic Exercise: - Group verbal and visual presentation on the components of exercise prescription. Introduces F.I.T.T principle from ACSM for exercise  prescriptions.  Reviews F.I.T.T. principles of aerobic exercise including progression. Written material given at graduation.   Education: Resistance Exercise: - Group verbal and visual presentation on the components of exercise prescription. Introduces F.I.T.T principle from ACSM for exercise prescriptions  Reviews F.I.T.T. principles of resistance exercise including progression. Written material given at graduation.    Education: Exercise & Equipment Safety: - Individual verbal instruction and demonstration of equipment use and safety with use of the equipment. Flowsheet Row Cardiac Rehab from 05/03/2020 in [facility] Cardiac and Pulmonary Rehab  Education need  identified 04/12/20  Date 04/12/20  Educator [place]  Instruction Review Code 1- Verbalizes Understanding      Education: Exercise Physiology & General Exercise Guidelines: - Group verbal and written instruction with models to review the exercise physiology of the cardiovascular system and associated critical values. Provides general exercise guidelines with specific guidelines to those with heart or lung disease.  Flowsheet Row Cardiac Rehab from 05/03/2020 in [facility] Cardiac and Pulmonary Rehab  Date 05/03/20  Educator [facility]  Instruction Review Code 1- Verbalizes Understanding      Education: Flexibility, Balance, Mind/Body Relaxation: - Group verbal and visual presentation with interactive activity on the components of exercise prescription. Introduces F.I.T.T principle from ACSM for exercise prescriptions. Reviews F.I.T.T. principles of flexibility and balance exercise training including progression. Also discusses the mind body connection.  Reviews various relaxation techniques to help reduce and manage stress (i.e. Deep breathing, progressive muscle relaxation, and visualization). Balance handout provided to take home. Written material given at graduation.   Activity Barriers & Risk Stratification:  Activity Barriers & Cardiac Risk Stratification - 04/12/20 1350      Activity Barriers & Cardiac Risk Stratification   Activity Barriers Arthritis    Cardiac Risk Stratification High           6 Minute Walk:  6 Minute Walk    Row Name 04/12/20 1349         6 Minute Walk   Phase Initial     Distance 1180 feet     Walk Time 6 minutes     # of Rest Breaks 0     MPH 2.23     METS 2.7     RPE 7     Perceived Dyspnea  0     VO2 Peak 9.45     Symptoms No     Resting HR 80 bpm     Resting BP 116/64     Resting Oxygen Saturation  97 %     Exercise Oxygen Saturation  during 6 min walk 93 %     Max Ex. HR 91 bpm     Max Ex. BP 140/66     2 Minute Post BP 122/66            Oxygen  Initial Assessment:   Oxygen Re-Evaluation:   Oxygen Discharge (Final Oxygen Re-Evaluation):   Initial Exercise Prescription:  Initial Exercise Prescription - 04/12/20 1300      Date of Initial Exercise RX and Referring Provider   Date 04/12/20    Referring Provider [name] MD ([facility])      Treadmill   MPH 2.3    Grade 0.5    Minutes 15    METs 2.92      Recumbant Bike   Level 3    RPM 60    Watts 20    Minutes 15    METs 2.7      NuStep   Level 2  SPM 80    Minutes 15    METs 2.7      T5 Nustep   Level 2    SPM 80    Minutes 15    METs 2.7      Prescription Details   Frequency (times per week) 2    Duration Progress to 30 minutes of continuous aerobic without signs/symptoms of physical distress      Intensity   THRR 40-80% of Max Heartrate 110-141    Ratings of Perceived Exertion 11-13    Perceived Dyspnea 0-4      Progression   Progression Continue to progress workloads to maintain intensity without signs/symptoms of physical distress.      Resistance Training   Training Prescription Yes    Weight 3 lb    Reps 10-15           Perform Capillary Blood Glucose checks as needed.  Exercise Prescription Changes:  Exercise Prescription Changes    Row Name 04/12/20 1400 04/25/20 1300 05/08/20 1500 05/21/20 1100 06/06/20 1000     Response to Exercise   Blood Pressure (Admit) 116/64 128/72 134/66 144/78 120/78   Blood Pressure (Exercise) 140/66 150/80 144/66 138/76 130/62   Blood Pressure (Exit) 122/66 130/72 132/68 142/80 104/60   Heart Rate (Admit) 80 bpm 75 bpm 88 bpm 83 bpm 67 bpm   Heart Rate (Exercise) 91 bpm 97 bpm 91 bpm 94 bpm 95 bpm   Heart Rate (Exit) 81 bpm 78 bpm 85 bpm 85 bpm 80 bpm   Oxygen Saturation (Admit) 97 % -- -- -- --   Oxygen Saturation (Exercise) 93 % -- -- -- --   Oxygen Saturation (Exit) 97 % -- -- -- --   Rating of Perceived Exertion (Exercise) 7 13 15 14 15    Perceived Dyspnea (Exercise) 0 -- -- -- --   Symptoms  none none none none none   Comments walk test results second day -- -- --   Duration -- Progress to 30 minutes of  aerobic without signs/symptoms of physical distress Continue with 30 min of aerobic exercise without signs/symptoms of physical distress. Continue with 30 min of aerobic exercise without signs/symptoms of physical distress. Continue with 30 min of aerobic exercise without signs/symptoms of physical distress.   Intensity -- THRR unchanged THRR unchanged THRR unchanged THRR unchanged     Progression   Progression -- Continue to progress workloads to maintain intensity without signs/symptoms of physical distress. Continue to progress workloads to maintain intensity without signs/symptoms of physical distress. Continue to progress workloads to maintain intensity without signs/symptoms of physical distress. Continue to progress workloads to maintain intensity without signs/symptoms of physical distress.   Average METs -- 2.7 2.8 3.3 3.02     Resistance Training   Training Prescription Yes Yes Yes Yes Yes   Weight 3 lb 3 lb 4 lb 4 lb 5 lb   Reps 10-15 10-15 10-15 10-15 10-15     Interval Training   Interval Training -- -- No No No     Treadmill   MPH 2.3 -- 1.7 -- 2.2   Grade 0.5 -- 0 -- 0   Minutes 15 -- 15 -- 15   METs 2.92 -- 2.3 -- 2.68     Recumbant Bike   Level 3 3 4 6 6    RPM 60 60 -- -- --   Watts 20 29 32 -- 47   Minutes 15 15 15 15 15    METs 2.7 2.86 2.9 2.6  3.28     NuStep   Level $Remo'2 3 4 5 'lhvPC$ --   SPM 80 80 -- 80 --   Minutes $Remove'15 15 15 15 'XfpXcyh$ --   METs 2.7 2.3 3.3 4 --     Arm Ergometer   Level -- -- -- -- 1   Minutes -- -- -- -- 15     Elliptical   Level -- -- -- -- 1   Speed -- -- -- -- 2.5   Minutes -- -- -- -- 4     T5 Nustep   Level 2 -- 3 -- --   SPM 80 -- -- -- --   Minutes 15 -- 15 -- --   METs 2.7 -- 2.7 -- --     Home Exercise Plan   Plans to continue exercise at -- -- [facility]  and walking at home [facility]  and walking at  home [facility]  and walking at home   Frequency -- -- Add 2 additional days to program exercise sessions. Add 2 additional days to program exercise sessions. Add 2 additional days to program exercise sessions.   Initial Home Exercises Provided -- -- 04/26/20 04/26/20 04/26/20          Exercise Comments:  Exercise Comments    Row Name 04/17/20 0931           Exercise Comments First full day of exercise!  Patient was oriented to gym and equipment including functions, settings, policies, and procedures.  Patient's individual exercise prescription and treatment plan were reviewed.  All starting workloads were established based on the results of the 6 minute walk test done at initial orientation visit.  The plan for exercise progression was also introduced and progression will be customized based on patient's performance and goals.              Exercise Goals and Review:  Exercise Goals    Row Name 04/12/20 1406             Exercise Goals   Increase Physical Activity Yes       Intervention Provide advice, education, support and counseling about physical activity/exercise needs.;Develop an individualized exercise prescription for aerobic and resistive training based on initial evaluation findings, risk stratification, comorbidities and participant's personal goals.       Expected Outcomes Short Term: Attend rehab on a regular basis to increase amount of physical activity.;Long Term: Add in home exercise to make exercise part of routine and to increase amount of physical activity.;Long Term: Exercising regularly at least 3-5 days a week.       Increase Strength and Stamina Yes       Intervention Provide advice, education, support and counseling about physical activity/exercise needs.;Develop an individualized exercise prescription for aerobic and resistive training based on initial evaluation findings, risk stratification, comorbidities and participant's personal goals.        Expected Outcomes Short Term: Increase workloads from initial exercise prescription for resistance, speed, and METs.;Short Term: Perform resistance training exercises routinely during rehab and add in resistance training at home;Long Term: Improve cardiorespiratory fitness, muscular endurance and strength as measured by increased METs and functional capacity (6MWT)       Able to understand and use rate of perceived exertion (RPE) scale Yes       Intervention Provide education and explanation on how to use RPE scale       Expected Outcomes Short Term: Able to use RPE daily in rehab  to express subjective intensity level;Long Term:  Able to use RPE to guide intensity level when exercising independently       Able to understand and use Dyspnea scale Yes       Intervention Provide education and explanation on how to use Dyspnea scale       Expected Outcomes Short Term: Able to use Dyspnea scale daily in rehab to express subjective sense of shortness of breath during exertion;Long Term: Able to use Dyspnea scale to guide intensity level when exercising independently       Knowledge and understanding of Target Heart Rate Range (THRR) Yes       Intervention Provide education and explanation of THRR including how the numbers were predicted and where they are located for reference       Expected Outcomes Short Term: Able to state/look up THRR;Short Term: Able to use daily as guideline for intensity in rehab;Long Term: Able to use THRR to govern intensity when exercising independently       Able to check pulse independently Yes       Intervention Provide education and demonstration on how to check pulse in carotid and radial arteries.;Review the importance of being able to check your own pulse for safety during independent exercise       Expected Outcomes Short Term: Able to explain why pulse checking is important during independent exercise;Long Term: Able to check pulse independently and accurately        Understanding of Exercise Prescription Yes       Intervention Provide education, explanation, and written materials on patient's individual exercise prescription       Expected Outcomes Short Term: Able to explain program exercise prescription;Long Term: Able to explain home exercise prescription to exercise independently              Exercise Goals Re-Evaluation :  Exercise Goals Re-Evaluation    Row Name 04/17/20 0932 04/25/20 1345 04/26/20 0945 05/08/20 1549 05/21/20 1145     Exercise Goal Re-Evaluation   Exercise Goals Review Able to understand and use rate of perceived exertion (RPE) scale;Able to understand and use Dyspnea scale;Knowledge and understanding of Target Heart Rate Range (THRR);Understanding of Exercise Prescription Increase Physical Activity;Increase Strength and Stamina Increase Physical Activity;Increase Strength and Stamina;Able to understand and use rate of perceived exertion (RPE) scale;Knowledge and understanding of Target Heart Rate Range (THRR);Able to check pulse independently Increase Physical Activity;Increase Strength and Stamina;Understanding of Exercise Prescription Increase Physical Activity;Increase Strength and Stamina   Comments Reviewed RPE and dyspnea scales, THR and program prescription with pt today.  Pt voiced understanding and was given a copy of goals to take home. Starr is off to a good start in rehab.  We will continue to monitor progress. Reviewed home exercise with pt today.  Pt plans to walk for exercise.  Reviewed THR, pulse, RPE, sign and symptoms, pulse oximetery and when to call 911 or MD.  Also discussed weather considerations and indoor options.  Pt voiced understanding. Orbin is doing well in rehab.  He is up to 32 watts on the bike.  We will continue to monitor his progress. Rashied continues to do well.  He has increased level on RB.   Expected Outcomes Short: Use RPE daily to regulate intensity. Long: Follow program prescription in THR. Short:  attend rehab consistently Long:  improve overall stamina Short: practice mointoring HR whil exercising Long:  increase MET level Short: Increase workload on treadmill Long: Continue to improve stamina. --  [place] Name 05/21/20 1147 05/22/20 0932 06/06/20 1046 07/02/20 1443       Exercise Goal Re-Evaluation   Exercise Goals Review -- Increase Physical Activity;Increase Strength and Stamina Increase Physical Activity;Increase Strength and Stamina;Understanding of Exercise Prescription --    Comments He has aso increased levels on TM. He reports walking at home (2.5 miles RPE 12) and doing balance exercise and handweights (5lbs) - 2x/week. He warms up and cools down at home when he is walking. Kailin is doing well in rehab.  He was able to get to 4 min on the ellipitcal and is up to 47 watts on the bike..  We will continue to monitor his progress. Out since last review, surgery scheduled for 4/29.    Expected Outcomes Short: continue to progress workloads Long:  increase overall MET level ST: continue to exercise consistently at home LT: increase MET levels Short: Return to regularly attendance Long: Continue to improve stamina. --           Discharge Exercise Prescription (Final Exercise Prescription Changes):  Exercise Prescription Changes - 06/06/20 1000      Response to Exercise   Blood Pressure (Admit) 120/78    Blood Pressure (Exercise) 130/62    Blood Pressure (Exit) 104/60    Heart Rate (Admit) 67 bpm    Heart Rate (Exercise) 95 bpm    Heart Rate (Exit) 80 bpm    Rating of Perceived Exertion (Exercise) 15    Symptoms none    Duration Continue with 30 min of aerobic exercise without signs/symptoms of physical distress.    Intensity THRR unchanged      Progression   Progression Continue to progress workloads to maintain intensity without signs/symptoms of physical distress.    Average METs 3.02      Resistance Training   Training Prescription Yes    Weight 5 lb    Reps 10-15       Interval Training   Interval Training No      Treadmill   MPH 2.2    Grade 0    Minutes 15    METs 2.68      Recumbant Bike   Level 6    Watts 47    Minutes 15    METs 3.28      Arm Ergometer   Level 1    Minutes 15      Elliptical   Level 1    Speed 2.5    Minutes 4      Home Exercise Plan   Plans to continue exercise at [facility]   and walking at home   Frequency Add 2 additional days to program exercise sessions.    Initial Home Exercises Provided 04/26/20           Nutrition:  Target Goals: Understanding of nutrition guidelines, daily intake of sodium '1500mg'$ , cholesterol '200mg'$ , calories 30% from fat and 7% or less from saturated fats, daily to have 5 or more servings of fruits and vegetables.  Education: All About Nutrition: -Group instruction provided by verbal, written material, interactive activities, discussions, models, and posters to present general guidelines for heart healthy nutrition including fat, fiber, MyPlate, the role of sodium in heart healthy nutrition, utilization of the nutrition label, and utilization of this knowledge for meal planning. Follow up email sent as well. Written material given at graduation.   Biometrics:  Pre Biometrics - 04/12/20 1407      Pre Biometrics   Height 5' 10.25" (1.784 m)  Weight 236 lb 11.2 oz (107.4 kg)    BMI (Calculated) 33.73    Single Leg Stand 6.28 seconds            Nutrition Therapy Plan and Nutrition Goals:  Nutrition Therapy & Goals - 04/25/20 0846      Nutrition Therapy   Diet Heart healthy, low Na    Drug/Food Interactions Statins/Certain Fruits    Protein (specify units) 85g    Fiber 30 grams    Whole Grain Foods 3 servings    Saturated Fats 12 max. grams    Fruits and Vegetables 8 servings/day    Sodium 1.5 grams      Personal Nutrition Goals   Nutrition Goal ST: for lunch have tuna or chicken salad instead of fast food LT: limit saturated fat to <12g/day    Comments  Coffee (equal and creamer) B: eggs with bacon, sausage, or ham L: take out (fast food) - burger D: frozen dinner  or cook (2x/week - he also eats leftovers) chicken and pork, spaghetti, soup, sometimes shrimp or fish. He has potatoes, peas, carrots, fried onions, can of mushrooms, beans (all), greens. He does not cook with salt, he adds a bit a sea salt at the end, but not always. He uses vegetable oil, he uses butter with grits. Drinks: water S: pork rinds and potato chips. He doesn't eat bread [facility] not [facility] and now he eats saltines. He tries to keep fruit like grapes and oranges. His teeth prevent him from having anything too hard. Discussed heart healthy eating. Discussed whole grains and suggested whole grain crackers instead of saltines if he doesn't care for bread. Suggested replacing lunch fast food with something prepared at home. Suggested limiting processed meat and pork rinds. Suggested more variety in vegetables (more non-starchy).      Intervention Plan   Intervention Prescribe, educate and counsel regarding individualized specific dietary modifications aiming towards targeted core components such as weight, hypertension, lipid management, diabetes, heart failure and other comorbidities.;Nutrition handout(s) given to patient.    Expected Outcomes Short Term Goal: Understand basic principles of dietary content, such as calories, fat, sodium, cholesterol and nutrients.;Short Term Goal: A plan has been developed with personal nutrition goals set during dietitian appointment.;Long Term Goal: Adherence to prescribed nutrition plan.           Nutrition Assessments:  MEDIFICTS Score Key:  ?70 Need to make dietary changes   40-70 Heart Healthy Diet  ? 40 Therapeutic Level Cholesterol Diet  Flowsheet Row Cardiac Rehab from 04/17/2020 in [facility] Cardiac and Pulmonary Rehab  Picture Your Plate Total Score on Admission 45     Picture Your Plate Scores:  <78 Unhealthy dietary  pattern with much room for improvement.  41-50 Dietary pattern unlikely to meet recommendations for good health and room for improvement.  51-60 More healthful dietary pattern, with some room for improvement.   >60 Healthy dietary pattern, although there may be some specific behaviors that could be improved.    Nutrition Goals Re-Evaluation:  Nutrition Goals Re-Evaluation    [place] Name 05/22/20 [phone]             Goals   Nutrition Goal ST: continue with current changes LT: limit saturated fat to <12g/day       Comment He reports having chicken salad and tuna salad for lunch, has oatmeal in the morning, and small piece of meat with vegetables for dinner. He reports trying to go to the dentist  to get false teeth to eat more. He would not like to make any further changes at this time and would like to continue with the changes he has made so far.       Expected Outcome ST: continue with current changes LT: limit saturated fat to <12g/day              Nutrition Goals Discharge (Final Nutrition Goals Re-Evaluation):  Nutrition Goals Re-Evaluation - 05/22/20 0939      Goals   Nutrition Goal ST: continue with current changes LT: limit saturated fat to <12g/day    Comment He reports having chicken salad and tuna salad for lunch, has oatmeal in the morning, and small piece of meat with vegetables for dinner. He reports trying to go to the dentist to get false teeth to eat more. He would not like to make any further changes at this time and would like to continue with the changes he has made so far.    Expected Outcome ST: continue with current changes LT: limit saturated fat to <12g/day           Psychosocial: Target Goals: Acknowledge presence or absence of significant depression and/or stress, maximize coping skills, provide positive support system. Participant is able to verbalize types and ability to use techniques and skills needed for reducing stress and depression.   Education:  Stress, Anxiety, and Depression - Group verbal and visual presentation to define topics covered.  Reviews how body is impacted by stress, anxiety, and depression.  Also discusses healthy ways to reduce stress and to treat/manage anxiety and depression.  Written material given at graduation. Flowsheet Row Cardiac Rehab from 05/03/2020 in [facility] Cardiac and Pulmonary Rehab  Date 04/26/20  Educator [facility]  Instruction Review Code 1- Verbalizes Understanding      Education: Sleep Hygiene -Provides group verbal and written instruction about how sleep can affect your health.  Define sleep hygiene, discuss sleep cycles and impact of sleep habits. Review good sleep hygiene tips.    Initial Review & Psychosocial Screening:  Initial Psych Review & Screening - 04/03/20 1553      Initial Review   Current issues with None Identified      Family Dynamics   Good Support System? Yes   cousin, friends, lots of kinfolks     Barriers   Psychosocial barriers to participate in program There are no identifiable barriers or psychosocial needs.;The patient should benefit from training in stress management and relaxation.      Screening Interventions   Interventions Encouraged to exercise;To provide support and resources with identified psychosocial needs;Provide feedback about the scores to participant    Expected Outcomes Short Term goal: Utilizing psychosocial counselor, staff and physician to assist with identification of specific Stressors or current issues interfering with healing process. Setting desired goal for each stressor or current issue identified.;Long Term Goal: Stressors or current issues are controlled or eliminated.;Short Term goal: Identification and review with participant of any Quality of Life or Depression concerns found by scoring the questionnaire.;Long Term goal: The participant improves quality of Life and PHQ9 Scores as seen by post scores and/or verbalization of changes            Quality of Life Scores:   Quality of Life - 04/12/20 1130      Quality of Life   Select Quality of Life      Quality of Life Scores   Health/Function Pre 16.33 %    Socioeconomic Pre 18.25 %  Psych/Spiritual Pre 18.43 %    Family Pre 16.25 %    GLOBAL Pre 17.21 %          Scores of 19 and below usually indicate a poorer quality of life in these areas.  A difference of  2-3 points is a clinically meaningful difference.  A difference of 2-3 points in the total score of the Quality of Life Index has been associated with significant improvement in overall quality of life, self-image, physical symptoms, and general health in studies assessing change in quality of life.  PHQ-9: Recent Review Flowsheet Data    Depression screen [facility] 2/9 04/12/2020   Decreased Interest 0   Down, Depressed, Hopeless 0   PHQ - 2 Score 0   Altered sleeping 0   Tired, decreased energy 1   Change in appetite 0   Feeling bad or failure about yourself  0   Trouble concentrating 0   Moving slowly or fidgety/restless 0   Suicidal thoughts 0   PHQ-9 Score 1   Difficult doing work/chores Not difficult at all     Interpretation of Total Score  Total Score Depression Severity:  1-4 = Minimal depression, 5-9 = Mild depression, 10-14 = Moderate depression, 15-19 = Moderately severe depression, 20-27 = Severe depression   Psychosocial Evaluation and Intervention:  Psychosocial Evaluation - 04/03/20 1608      Psychosocial Evaluation & Interventions   Interventions Encouraged to exercise with the program and follow exercise prescription    Comments Adorian is ready to attend the prgram without any barriers. He lives alone and has no needs. He spends his time watching TV or sitting on the porch watching the cars go by. He has friends that drop in to see him. He is trying to Quit tobacco for an upcoming surgery. He has quit before and feels he can do this. He has nicotine patches and gum being sent to him from  the [place]. He should do well with the program, as he is looking forward to getting stronger and quitting tobacco.    Expected Outcomes STG; Liliane Channel attends all session scheduled LTG: Trystian is able to accomplish tobacco cessation and then maintain it after discharge    Continue Psychosocial Services  Follow up required by staff           Psychosocial Re-Evaluation:  Psychosocial Re-Evaluation    [place] Name 04/26/20 0934 05/22/20 0935           Psychosocial Re-Evaluation   Current issues with Current Stress Concerns;Current Sleep Concerns --      Comments Andree reports no new stress concerns.  He says he sleeps some during the day.  He has trouble staying asleep sometimes.  He usually wakes up to go to the bathroom. He goes back to sleep easily. Shubh reports no new stress concerns.  He reports he is a good napper and will sleep sometimes during the day.  He has trouble staying asleep sometimes due to going to the bathroom - he goes back to sleep easily. He reports he is bored sometimes at home due to the cold and is excited for the warm weather activities.      Expected Outcomes Short: continue to attend class for exercise Long: maintain positive outlook Short: continue to attend class for exercise Long: maintain positive outlook      Interventions -- Encouraged to attend Cardiac Rehabilitation for the exercise      Continue Psychosocial Services  -- Follow up required by staff  Psychosocial Discharge (Final Psychosocial Re-Evaluation):  Psychosocial Re-Evaluation - 05/22/20 0935      Psychosocial Re-Evaluation   Comments Efe reports no new stress concerns.  He reports he is a good napper and will sleep sometimes during the day.  He has trouble staying asleep sometimes due to going to the bathroom - he goes back to sleep easily. He reports he is bored sometimes at home due to the cold and is excited for the warm weather activities.    Expected Outcomes Short: continue to attend  class for exercise Long: maintain positive outlook    Interventions Encouraged to attend Cardiac Rehabilitation for the exercise    Continue Psychosocial Services  Follow up required by staff           Vocational Rehabilitation: Provide vocational rehab assistance to qualifying candidates.   Vocational Rehab Evaluation & Intervention:  Vocational Rehab - 04/03/20 1555      Initial Vocational Rehab Evaluation & Intervention   Assessment shows need for Vocational Rehabilitation No           Education: Education Goals: Education classes will be provided on a variety of topics geared toward better understanding of heart health and risk factor modification. Participant will state understanding/return demonstration of topics presented as noted by education test scores.  Learning Barriers/Preferences:  Learning Barriers/Preferences - 04/03/20 1554      Learning Barriers/Preferences   Learning Barriers None    Learning Preferences None           General Cardiac Education Topics:  AED/CPR: - Group verbal and written instruction with the use of models to demonstrate the basic use of the AED with the basic ABC's of resuscitation.   Anatomy and Cardiac Procedures: - Group verbal and visual presentation and models provide information about basic cardiac anatomy and function. Reviews the testing methods done to diagnose heart disease and the outcomes of the test results. Describes the treatment choices: Medical Management, Angioplasty, or Coronary Bypass Surgery for treating various heart conditions including Myocardial Infarction, Angina, Valve Disease, and Cardiac Arrhythmias.  Written material given at graduation.   Medication Safety: - Group verbal and visual instruction to review commonly prescribed medications for heart and lung disease. Reviews the medication, class of the drug, and side effects. Includes the steps to properly store meds and maintain the prescription regimen.   Written material given at graduation.   Intimacy: - Group verbal instruction through game format to discuss how heart and lung disease can affect sexual intimacy. Written material given at graduation..   Know Your Numbers and Heart Failure: - Group verbal and visual instruction to discuss disease risk factors for cardiac and pulmonary disease and treatment options.  Reviews associated critical values for Overweight/Obesity, Hypertension, Cholesterol, and Diabetes.  Discusses basics of heart failure: signs/symptoms and treatments.  Introduces Heart Failure Zone chart for action plan for heart failure.  Written material given at graduation.   Infection Prevention: - Provides verbal and written material to individual with discussion of infection control including proper hand washing and proper equipment cleaning during exercise session. Flowsheet Row Cardiac Rehab from 05/03/2020 in [facility] Cardiac and Pulmonary Rehab  Education need identified 04/12/20  Date 04/12/20  Educator [place]  Instruction Review Code 1- Verbalizes Understanding      Falls Prevention: - Provides verbal and written material to individual with discussion of falls prevention and safety. Flowsheet Row Cardiac Rehab from 04/03/2020 in [facility] Cardiac and Pulmonary Rehab  Date 04/03/20  Educator [place]  Instruction  Review Code 1- Verbalizes Understanding      Other: -Provides group and verbal instruction on various topics (see comments)   Knowledge Questionnaire Score:  Knowledge Questionnaire Score - 04/12/20 1127      Knowledge Questionnaire Score   Pre Score 23/26: Angina, Heart disease, Exercise           Core Components/Risk Factors/Patient Goals at Admission:  Personal Goals and Risk Factors at Admission - 04/12/20 1408      Core Components/Risk Factors/Patient Goals on Admission    Weight Management Weight Loss    Intervention Weight Management: Develop a combined nutrition and exercise program designed to  reach desired caloric intake, while maintaining appropriate intake of nutrient and fiber, sodium and fats, and appropriate energy expenditure required for the weight goal.;Weight Management/Obesity: Establish reasonable short term and long term weight goals.    Admit Weight 236 lb (107 kg)    Goal Weight: Short Term 231 lb (104.8 kg)    Goal Weight: Long Term 190 lb (86.2 kg)    Expected Outcomes Short Term: Continue to assess and modify interventions until short term weight is achieved;Long Term: Adherence to nutrition and physical activity/exercise program aimed toward attainment of established weight goal;Weight Loss: Understanding of general recommendations for a balanced deficit meal plan, which promotes 1-2 lb weight loss per week and includes a negative energy balance of [phone] kcal/d;Understanding recommendations for meals to include 15-35% energy as protein, 25-35% energy from fat, 35-60% energy from carbohydrates, less than $RemoveB'200mg'EJUBobwS$  of dietary cholesterol, 20-35 gm of total fiber daily;Understanding of distribution of calorie intake throughout the day with the consumption of 4-5 meals/snacks    Tobacco Cessation Yes   Trying the nicotine patches, does not have a quit date but thinking about quitting   Number of packs per day Tavari is a current tobacco user. Intervention for tobacco cessation was provided at the initial medical review. He was asked about readiness to quit and reported he is ready to quit . Patient was advised and educated about tobacco cessation using combination therapy, tobacco cessation classes, quit line, and quit smoking apps. Patient demonstrated understanding of this material. Staff will continue to provide encouragement and follow up with the patient throughout the program. Breken currently smokes about 1/2 pack/day.    Intervention Assist the participant in steps to quit. Provide individualized education and counseling about committing to Tobacco Cessation, relapse prevention,  and pharmacological support that can be provided by physician.;Advice worker, assist with locating and accessing local/national Quit Smoking programs, and support quit date choice.    Expected Outcomes Short Term: Will demonstrate readiness to quit, by selecting a quit date.;Short Term: Will quit all tobacco product use, adhering to prevention of relapse plan.;Long Term: Complete abstinence from all tobacco products for at least 12 months from quit date.    Diabetes Yes    Intervention Provide education about signs/symptoms and action to take for hypo/hyperglycemia.;Provide education about proper nutrition, including hydration, and aerobic/resistive exercise prescription along with prescribed medications to achieve blood glucose in normal ranges: Fasting glucose 65-99 mg/dL    Expected Outcomes Short Term: Participant verbalizes understanding of the signs/symptoms and immediate care of hyper/hypoglycemia, proper foot care and importance of medication, aerobic/resistive exercise and nutrition plan for blood glucose control.;Long Term: Attainment of HbA1C < 7%.    Heart Failure Yes    Intervention Provide a combined exercise and nutrition program that is supplemented with education, support and counseling about heart failure. Directed toward relieving symptoms such  as shortness of breath, decreased exercise tolerance, and extremity edema.    Expected Outcomes Improve functional capacity of life;Short term: Attendance in program 2-3 days a week with increased exercise capacity. Reported lower sodium intake. Reported increased fruit and vegetable intake. Reports medication compliance.;Short term: Daily weights obtained and reported for increase. Utilizing diuretic protocols set by physician.;Long term: Adoption of self-care skills and reduction of barriers for early signs and symptoms recognition and intervention leading to self-care maintenance.    Hypertension Yes    Intervention Provide  education on lifestyle modifcations including regular physical activity/exercise, weight management, moderate sodium restriction and increased consumption of fresh fruit, vegetables, and low fat dairy, alcohol moderation, and smoking cessation.;Monitor prescription use compliance.    Expected Outcomes Short Term: Continued assessment and intervention until BP is < 140/30mm HG in hypertensive participants. < 130/23mm HG in hypertensive participants with diabetes, heart failure or chronic kidney disease.;Long Term: Maintenance of blood pressure at goal levels.    Lipids Yes    Intervention Provide education and support for participant on nutrition & aerobic/resistive exercise along with prescribed medications to achieve LDL '70mg'$ , HDL >$Remo'40mg'qngSX$ .    Expected Outcomes Short Term: Participant states understanding of desired cholesterol values and is compliant with medications prescribed. Participant is following exercise prescription and nutrition guidelines.;Long Term: Cholesterol controlled with medications as prescribed, with individualized exercise RX and with personalized nutrition plan. Value goals: LDL < $Rem'70mg'pbHA$ , HDL > 40 mg.           Education:Diabetes - Individual verbal and written instruction to review signs/symptoms of diabetes, desired ranges of glucose level fasting, after meals and with exercise. Acknowledge that pre and post exercise glucose checks will be done for 3 sessions at entry of program. Industry from 05/03/2020 in [facility] Cardiac and Pulmonary Rehab  Education need identified 04/12/20  Date 04/12/20  Educator [place]  Instruction Review Code 1- Verbalizes Understanding      Core Components/Risk Factors/Patient Goals Review:   Goals and Risk Factor Review    Row Name 04/26/20 0931 05/22/20 0937           Core Components/Risk Factors/Patient Goals Review   Personal Goals Review Diabetes;Hypertension;Tobacco Cessation Diabetes;Hypertension;Tobacco Cessation       Review Keng states he is taking medications as directed.  He monitors BG and BP at home daily.  FBG averages 174.  resting BP today 112/70.  He has reduced to 5 cigarettes.  He has thoracic suregry for esophageal cancer coming up and will have to stop smoking at that point.  He does use a patch sometimes. Rylyn is taking his medications as directed. BG is about 120 in the am; can be 110 to 200 (once every two weeks around 200 if he ate something sweet the night before. He still checks his BP at home 130/80. He reports he is now down to 2 cigarettes and his quit date is his surgery date 3/29.      Expected Outcomes Short:  continue to monitor raisk factors Long: quit smoking Short:  continue to monitor raisk factors, continue to check BP and BG at home  Long: quit smoking             Core Components/Risk Factors/Patient Goals at Discharge (Final Review):   Goals and Risk Factor Review - 05/22/20 0937      Core Components/Risk Factors/Patient Goals Review   Personal Goals Review Diabetes;Hypertension;Tobacco Cessation    Review Jamiah is taking his medications as directed. BG is  about 120 in the am; can be 110 to 200 (once every two weeks around 200 if he ate something sweet the night before. He still checks his BP at home 130/80. He reports he is now down to 2 cigarettes and his quit date is his surgery date 3/29.    Expected Outcomes Short:  continue to monitor raisk factors, continue to check BP and BG at home  Long: quit smoking           ITP Comments:  ITP Comments    Row Name 04/03/20 1615 04/12/20 1201 04/17/20 0931 04/18/20 0959 04/25/20 0910   ITP Comments irtual orientation call completed today. he has an appointment on Date: 04/09/2020 for EP eval and gym Orientation.  Documentation of diagnosis can be found in media scan [facility] records  .   Stephfon is a current tobacco user. Intervention for tobacco cessation was provided at the initial medical review. He was asked about readiness to quit  and reported he is ready to quit . Patient was advised and educated about tobacco cessation using combination therapy, tobacco cessation classes, quit line, and quit smoking apps. Patient demonstrated understanding of this material. Staff will continue to provide encouragement and follow up with the patient throughout the program. Completed 6MWT and gym orientation. Initial ITP created and sent for review to Dr. Ramonita Lab.. First full day of exercise!  Patient was oriented to gym and equipment including functions, settings, policies, and procedures.  Patient's individual exercise prescription and treatment plan were reviewed.  All starting workloads were established based on the results of the 6 minute walk test done at initial orientation visit.  The plan for exercise progression was also introduced and progression will be customized based on patient's performance and goals. 30 Day review completed. Medical Director ITP review done, changes made as directed, and signed approval by Medical Director.   New to program Completed initial RD evaluation   Row Name 05/16/20 0715 06/13/20 1129 06/14/20 1105 06/19/20 1348 07/02/20 1442   ITP Comments 30 Day review completed. Medical Director ITP review done, changes made as directed, and signed approval by Medical Director. 30 Day review completed. Medical Director ITP review done, changes made as directed, and signed approval by Medical Director. Called patient as he has not been to cardiac rehab in a couple of weeks. Patient states that he has a surgery scheduled for 3/31 and will not be coming for the time being. He will need to be put on a medical hold until he has clearance to come back to exercise. Patient is aware. Niko has not attended since last review due to other medical issues. Surgery scheduled for 4/29.   [place] Name 07/11/20 0641 07/31/20 1035         ITP Comments 30 Day review completed. Medical Director ITP review done, changes made as directed, and  signed approval by Medical Director. out for medical reasons Lynden is still in hospital after his esphogeal surgery on 4/29.  He is having afib with RVR and desaturation problems since surgery.  We will discharge him at this time.             Comments: Discharge ITP

## 2020-07-31 NOTE — Progress Notes (Signed)
Discharge Progress Report  Patient Details  Name: Hunter Tran MRN: [id number] Date of Birth: Sep 13, 1955 Referring Provider:   Flowsheet Row Cardiac Rehab from 04/12/2020 in [facility]  Referring Provider [name] MD ([place])       Number of Visits: 14  Reason for Discharge:  Early Exit:  Lack of attendance and Admission for surgery  Smoking History:  Social History   Tobacco Use  Smoking Status Current Some Day Smoker  . Packs/day: 1.00  . Years: 50.00  . Pack years: 50.00  . Types: Cigarettes  Smokeless Tobacco Never Used  Tobacco Comment   Quit date soon     Diagnosis:  Heart failure, chronic systolic (HCC)  ADL [facility]SD:   Initial Exercise Prescription:  Initial Exercise Prescription - 04/12/20 1300      Date of Initial Exercise RX and Referring Provider   Date 04/12/20    Referring Provider [name] [facility] ([facility])      Treadmill   MPH 2.3    Grade 0.5    Minutes 15    METs 2.92      Recumbant Bike   Level 3    RPM 60    Watts 20    Minutes 15    METs 2.7      NuStep   Level 2    SPM 80    Minutes 15    METs 2.7      T5 Nustep   Level 2    SPM 80    Minutes 15    METs 2.7      Prescription Details   Frequency (times per week) 2    Duration Progress to 30 minutes of continuous aerobic without signs/symptoms of physical distress      Intensity   THRR 40-80% of Max Heartrate 110-141    Ratings of Perceived Exertion 11-13    Perceived Dyspnea 0-4      Progression   Progression Continue to progress workloads to maintain intensity without signs/symptoms of physical distress.      Resistance Training   Training Prescription Yes    Weight 3 lb    Reps 10-15           Discharge Exercise Prescription (Final Exercise Prescription Changes):  Exercise Prescription Changes - 06/06/20 1000      Response to Exercise   Blood Pressure (Admit) 120/78    Blood Pressure (Exercise) 130/62    Blood Pressure (Exit) 104/60     Heart Rate (Admit) 67 bpm    Heart Rate (Exercise) 95 bpm    Heart Rate (Exit) 80 bpm    Rating of Perceived Exertion (Exercise) 15    Symptoms none    Duration Continue with 30 min of aerobic exercise without signs/symptoms of physical distress.    Intensity THRR unchanged      Progression   Progression Continue to progress workloads to maintain intensity without signs/symptoms of physical distress.    Average METs 3.02      Resistance Training   Training Prescription Yes    Weight 5 lb    Reps 10-15      Interval Training   Interval Training No      Treadmill   MPH 2.2    Grade 0    Minutes 15    METs 2.68      Recumbant Bike   Level 6    Watts 47    Minutes 15    METs 3.28  Arm Ergometer   Level 1    Minutes 15      Elliptical   Level 1    Speed 2.5    Minutes 4      Home Exercise Plan   Plans to continue exercise at [facility]   and walking at home   Frequency Add 2 additional days to program exercise sessions.    Initial Home Exercises Provided 04/26/20           Functional Capacity:  6 Minute Walk    Row Name 04/12/20 1349         6 Minute Walk   Phase Initial     Distance 1180 feet     Walk Time 6 minutes     # of Rest Breaks 0     MPH 2.23     METS 2.7     RPE 7     Perceived Dyspnea  0     VO2 Peak 9.45     Symptoms No     Resting HR 80 bpm     Resting BP 116/64     Resting Oxygen Saturation  97 %     Exercise Oxygen Saturation  during 6 min walk 93 %     Max Ex. HR 91 bpm     Max Ex. BP 140/66     2 Minute Post BP 122/66            Psychological, QOL, Others - Outcomes: PHQ 2/9: Depression screen PHQ 2/9 04/12/2020  Decreased Interest 0  Down, Depressed, Hopeless 0  PHQ - 2 Score 0  Altered sleeping 0  Tired, decreased energy 1  Change in appetite 0  Feeling bad or failure about yourself  0  Trouble concentrating 0  Moving slowly or fidgety/restless 0  Suicidal thoughts 0  PHQ-9 Score 1  Difficult doing  work/chores Not difficult at all    Quality of Life:  Quality of Life - 04/12/20 1130      Quality of Life   Select Quality of Life      Quality of Life Scores   Health/Function Pre 16.33 %    Socioeconomic Pre 18.25 %    Psych/Spiritual Pre 18.43 %    Family Pre 16.25 %    GLOBAL Pre 17.21 %          Nutrition & Weight - Outcomes:  Pre Biometrics - 04/12/20 1407      Pre Biometrics   Height 5' 10.25" (1.784 m)    Weight 236 lb 11.2 oz (107.4 kg)    BMI (Calculated) 33.73    Single Leg Stand 6.28 seconds            Nutrition:  Nutrition Therapy & Goals - 04/25/20 0846      Nutrition Therapy   Diet Heart healthy, low Na    Drug/Food Interactions Statins/Certain Fruits    Protein (specify units) 85g    Fiber 30 grams    Whole Grain Foods 3 servings    Saturated Fats 12 max. grams    Fruits and Vegetables 8 servings/day    Sodium 1.5 grams      Personal Nutrition Goals   Nutrition Goal ST: for lunch have tuna or chicken salad instead of fast food LT: limit saturated fat to <12g/day    Comments Coffee (equal and creamer) B: eggs with bacon, sausage, or ham L: take out (fast food) - burger D: frozen dinner  or cook (2x/week -  he also eats leftovers) chicken and pork, spaghetti, soup, sometimes shrimp or fish. He has potatoes, peas, carrots, fried onions, can of mushrooms, beans (all), greens. He does not cook with salt, he adds a bit a sea salt at the end, but not always. He uses vegetable oil, he uses butter with grits. Drinks: water S: pork rinds and potato chips. He doesn't eat bread because [facility] not to and now he eats saltines. He tries to keep fruit like grapes and oranges. His teeth prevent him from having anything too hard. Discussed heart healthy eating. Discussed whole grains and suggested whole grain crackers instead of saltines if he doesn't care for bread. Suggested replacing lunch fast food with something prepared at home. Suggested limiting  processed meat and pork rinds. Suggested more variety in vegetables (more non-starchy).      Intervention Plan   Intervention Prescribe, educate and counsel regarding individualized specific dietary modifications aiming towards targeted core components such as weight, hypertension, lipid management, diabetes, heart failure and other comorbidities.;Nutrition handout(s) given to patient.    Expected Outcomes Short Term Goal: Understand basic principles of dietary content, such as calories, fat, sodium, cholesterol and nutrients.;Short Term Goal: A plan has been developed with personal nutrition goals set during dietitian appointment.;Long Term Goal: Adherence to prescribed nutrition plan.           Nutrition Discharge:   Education Questionnaire Score:  Knowledge Questionnaire Score - 04/12/20 1127      Knowledge Questionnaire Score   Pre Score 23/26: Angina, Heart disease, Exercise           Goals reviewed with patient; copy given to patient.
